# Patient Record
Sex: Female | Born: 1935 | Race: White | Hispanic: No | Smoking: Never smoker
Health system: Southern US, Community
[De-identification: ages and names within clinical notes are randomized; demographics above are authoritative.]

## PROBLEM LIST (undated history)

## (undated) DIAGNOSIS — I341 Nonrheumatic mitral (valve) prolapse: Secondary | ICD-10-CM

## (undated) DIAGNOSIS — I4891 Unspecified atrial fibrillation: Secondary | ICD-10-CM

## (undated) DIAGNOSIS — I10 Essential (primary) hypertension: Secondary | ICD-10-CM

## (undated) DIAGNOSIS — M5136 Other intervertebral disc degeneration, lumbar region: Secondary | ICD-10-CM

## (undated) DIAGNOSIS — K649 Unspecified hemorrhoids: Secondary | ICD-10-CM

## (undated) DIAGNOSIS — J449 Chronic obstructive pulmonary disease, unspecified: Secondary | ICD-10-CM

## (undated) DIAGNOSIS — I482 Chronic atrial fibrillation, unspecified: Secondary | ICD-10-CM

## (undated) DIAGNOSIS — H409 Unspecified glaucoma: Secondary | ICD-10-CM

## (undated) DIAGNOSIS — R002 Palpitations: Secondary | ICD-10-CM

## (undated) DIAGNOSIS — M810 Age-related osteoporosis without current pathological fracture: Secondary | ICD-10-CM

## (undated) DIAGNOSIS — K5790 Diverticulosis of intestine, part unspecified, without perforation or abscess without bleeding: Secondary | ICD-10-CM

## (undated) DIAGNOSIS — H25019 Cortical age-related cataract, unspecified eye: Secondary | ICD-10-CM

## (undated) DIAGNOSIS — D126 Benign neoplasm of colon, unspecified: Secondary | ICD-10-CM

## (undated) DIAGNOSIS — B019 Varicella without complication: Secondary | ICD-10-CM

## (undated) HISTORY — PX: CHOLECYSTECTOMY: SHX55

## (undated) HISTORY — DX: Palpitations: R00.2

## (undated) HISTORY — DX: Other intervertebral disc degeneration, lumbar region: M51.36

## (undated) HISTORY — PX: CATARACT EXTRACTION: SUR2

## (undated) HISTORY — DX: Varicella without complication: B01.9

## (undated) HISTORY — DX: Unspecified glaucoma: H40.9

## (undated) HISTORY — PX: CORRECTION HAMMER TOE: SUR317

## (undated) HISTORY — DX: Benign neoplasm of colon, unspecified: D12.6

## (undated) HISTORY — DX: Cortical age-related cataract, unspecified eye: H25.019

## (undated) HISTORY — DX: Chronic atrial fibrillation, unspecified: I48.20

## (undated) HISTORY — PX: BREAST LUMPECTOMY: SHX2

## (undated) HISTORY — DX: Unspecified hemorrhoids: K64.9

---

## 1998-11-07 ENCOUNTER — Ambulatory Visit (HOSPITAL_COMMUNITY): Admission: RE | Admit: 1998-11-07 | Discharge: 1998-11-07 | Payer: Self-pay | Admitting: Obstetrics & Gynecology

## 1999-11-06 ENCOUNTER — Ambulatory Visit (HOSPITAL_COMMUNITY): Admission: RE | Admit: 1999-11-06 | Discharge: 1999-11-06 | Payer: Self-pay | Admitting: Obstetrics & Gynecology

## 2000-05-11 ENCOUNTER — Ambulatory Visit (HOSPITAL_COMMUNITY): Admission: RE | Admit: 2000-05-11 | Discharge: 2000-05-11 | Payer: Self-pay | Admitting: *Deleted

## 2000-08-01 HISTORY — PX: COLONOSCOPY: SHX174

## 2000-11-18 ENCOUNTER — Ambulatory Visit (HOSPITAL_COMMUNITY): Admission: RE | Admit: 2000-11-18 | Discharge: 2000-11-18 | Payer: Self-pay | Admitting: *Deleted

## 2002-01-19 ENCOUNTER — Ambulatory Visit (HOSPITAL_COMMUNITY): Admission: RE | Admit: 2002-01-19 | Discharge: 2002-01-19 | Payer: Self-pay | Admitting: Obstetrics & Gynecology

## 2003-02-01 ENCOUNTER — Ambulatory Visit (HOSPITAL_COMMUNITY): Admission: RE | Admit: 2003-02-01 | Discharge: 2003-02-01 | Payer: Self-pay | Admitting: *Deleted

## 2005-06-24 ENCOUNTER — Ambulatory Visit: Payer: Self-pay | Admitting: Family Medicine

## 2005-08-10 ENCOUNTER — Ambulatory Visit: Payer: Self-pay | Admitting: Gastroenterology

## 2006-06-27 ENCOUNTER — Ambulatory Visit: Payer: Self-pay | Admitting: Family Medicine

## 2007-06-30 ENCOUNTER — Ambulatory Visit: Payer: Self-pay | Admitting: Family Medicine

## 2008-04-09 ENCOUNTER — Ambulatory Visit: Payer: Self-pay | Admitting: Family Medicine

## 2008-07-02 ENCOUNTER — Ambulatory Visit: Payer: Self-pay | Admitting: Family Medicine

## 2009-07-15 ENCOUNTER — Ambulatory Visit: Payer: Self-pay | Admitting: Family Medicine

## 2009-08-20 ENCOUNTER — Ambulatory Visit: Payer: Self-pay | Admitting: Ophthalmology

## 2009-09-01 ENCOUNTER — Ambulatory Visit: Payer: Self-pay | Admitting: Ophthalmology

## 2010-07-27 ENCOUNTER — Ambulatory Visit: Payer: Self-pay | Admitting: Family Medicine

## 2011-09-21 ENCOUNTER — Ambulatory Visit: Payer: Self-pay | Admitting: Family Medicine

## 2012-03-14 ENCOUNTER — Ambulatory Visit: Payer: Self-pay | Admitting: Family Medicine

## 2012-09-27 ENCOUNTER — Ambulatory Visit: Payer: Self-pay | Admitting: Family Medicine

## 2014-05-16 ENCOUNTER — Ambulatory Visit: Payer: Self-pay | Admitting: Family Medicine

## 2014-09-25 ENCOUNTER — Observation Stay: Payer: Self-pay | Admitting: Internal Medicine

## 2014-09-25 LAB — COMPREHENSIVE METABOLIC PANEL
Albumin: 3.7 g/dL (ref 3.4–5.0)
Alkaline Phosphatase: 104 U/L
Anion Gap: 6 — ABNORMAL LOW (ref 7–16)
BUN: 21 mg/dL — ABNORMAL HIGH (ref 7–18)
Bilirubin,Total: 1.1 mg/dL — ABNORMAL HIGH (ref 0.2–1.0)
Calcium, Total: 8.8 mg/dL (ref 8.5–10.1)
Chloride: 106 mmol/L (ref 98–107)
Co2: 31 mmol/L (ref 21–32)
Creatinine: 0.68 mg/dL (ref 0.60–1.30)
EGFR (African American): >60
EGFR (Non-African Amer.): >60
Glucose: 114 mg/dL — ABNORMAL HIGH (ref 65–99)
Osmolality: 289 (ref 275–301)
Potassium: 4 mmol/L (ref 3.5–5.1)
SGOT(AST): 26 U/L (ref 15–37)
SGPT (ALT): 28 U/L
Sodium: 143 mmol/L (ref 136–145)
Total Protein: 7.1 g/dL (ref 6.4–8.2)

## 2014-09-25 LAB — TROPONIN I
Troponin-I: <0.02 ng/mL
Troponin-I: <0.02 ng/mL
Troponin-I: <0.02 ng/mL

## 2014-09-25 LAB — CBC WITH DIFFERENTIAL/PLATELET
Basophil #: 0.1 10*3/uL (ref 0.0–0.1)
Basophil %: 0.7 %
Eosinophil #: 0.3 10*3/uL (ref 0.0–0.7)
Eosinophil %: 2.5 %
HCT: 45.8 % (ref 35.0–47.0)
HGB: 14.9 g/dL (ref 12.0–16.0)
Lymphocyte #: 2.6 10*3/uL (ref 1.0–3.6)
Lymphocyte %: 24.3 %
MCH: 30.7 pg (ref 26.0–34.0)
MCHC: 32.5 g/dL (ref 32.0–36.0)
MCV: 95 fL (ref 80–100)
Monocyte #: 1.8 x10 3/mm — ABNORMAL HIGH (ref 0.2–0.9)
Monocyte %: 17 %
Neutrophil #: 5.9 10*3/uL (ref 1.4–6.5)
Neutrophil %: 55.5 %
Platelet: 203 10*3/uL (ref 150–440)
RBC: 4.84 10*6/uL (ref 3.80–5.20)
RDW: 12.9 % (ref 11.5–14.5)
WBC: 10.7 10*3/uL (ref 3.6–11.0)

## 2014-09-25 LAB — URINALYSIS, COMPLETE
Bacteria: NONE SEEN
Bilirubin,UR: NEGATIVE
Blood: NEGATIVE
Glucose,UR: NEGATIVE mg/dL (ref 0–75)
Ketone: NEGATIVE
Leukocyte Esterase: NEGATIVE
Nitrite: NEGATIVE
Ph: 7 (ref 4.5–8.0)
Protein: NEGATIVE
RBC,UR: 1 /HPF (ref 0–5)
Specific Gravity: 1.008 (ref 1.003–1.030)
Squamous Epithelial: <1
WBC UR: 2 /HPF (ref 0–5)

## 2014-09-25 LAB — CK TOTAL AND CKMB (NOT AT ARMC)
CK, Total: 47 U/L (ref 26–192)
CK-MB: 1.7 ng/mL (ref 0.5–3.6)

## 2014-09-25 LAB — CK-MB
CK-MB: 1.1 ng/mL (ref 0.5–3.6)
CK-MB: 1 ng/mL (ref 0.5–3.6)

## 2014-09-25 LAB — PROTIME-INR
INR: 1
Prothrombin Time: 13 secs (ref 11.5–14.7)

## 2014-09-26 LAB — T4, FREE: Free Thyroxine: 0.87 ng/dL (ref 0.76–1.46)

## 2014-09-26 LAB — TSH: Thyroid Stimulating Horm: 4.82 u[IU]/mL — ABNORMAL HIGH

## 2014-10-01 ENCOUNTER — Ambulatory Visit: Payer: Self-pay | Admitting: Internal Medicine

## 2015-02-22 NOTE — Consult Note (Signed)
PATIENT NAME:  Gina Blankenship, Gina Blankenship MR#:  [id number] DATE OF BIRTH:  02-04-1921  DATE OF CONSULTATION:  09/25/2014  REFERRING PHYSICIAN:  Norva Riffle. Marcille Blanco, MD CONSULTING PHYSICIAN:  Analis Distler D. Clayborn Bigness, MD  PRIMARY PHYSICIAN: Irven Easterly. Kary Kos, MD   INDICATIONS: Atrial fibrillation, shortness of breath.   HISTORY OF PRESENT ILLNESS: The patient is a 81 year old white female who presented to the Emergency Room with complaints of heart racing uncontrollably. The patient states her symptoms began while she was in bed. The patient was to go to sleep, awoke with recurrent symptoms, shortness of breath, headache, and palpitations. The patient went to the next room to alert her daughter-in-law who is an LPN. Daughter-in-law gave the patient a dose of metoprolol with aspirin. Subsequently, heart rate was found to be about 150. Because of the rapid atrial fibrillation, palpitations, weakness, fatigue, shortness of breath, she was brought to the Emergency Room.   REVIEW OF SYSTEMS: No blackout spells, syncope. No nausea or vomiting. No fever. No chills. No sweats. No weight loss, no weight gain, no hemoptysis, no hematemesis. No bright red blood per rectum. She has had weakness, fatigue, palpitations, tachycardia in the past.   PAST MEDICAL HISTORY: Atrial fibrillation, mitral valve insufficiency, glaucoma, weakness, fatigue.   PAST SURGICAL HISTORY: Cataract removal, cholecystectomy.   SOCIAL HISTORY: Lives with her son. No smoking or alcohol consumption. The patient lives independently. Still drives.   FAMILY HISTORY: Pancreatic cancer.   MEDICATIONS: Metoprolol.   ALLERGIES: None.   LABORATORIES: Glucose 114, BUN of 21, creatinine 0.68, sodium 143, potassium 4, chloride 106, bicarbonate 31, calcium 8.8, albumin 2.7. LFTs are negative. Troponin is negative. White count 10.7, hemoglobin of 14.9, hematocrit 45.8, platelet count of 209,000, MCV at 95, INR of 1.   Chest x-ray negative.   PHYSICAL  EXAMINATION: VITAL SIGNS: Blood pressure 110/50, pulse of 60, respiratory rate of 14, afebrile.  HEENT: Normocephalic, atraumatic. Pupils equal and reactive to light.  NECK:  Supple.  No significant JVD, bruits, adenopathy.  LUNGS: Clear to auscultation. No significant wheeze, rhonchi, or rales.  HEART: Bradycardic. Systolic ejection murmur along the apex. PMI nondisplaced.  ABDOMEN: Benign.  EXTREMITIES: Within normal limits. NEUROLOGIC: Intact.  SKIN: Normal.   ASSESSMENT: 1.  Atrial fibrillation, paroxysmal. 2.  Glaucoma.  3.  Mitral insufficiency murmur.  5.  Shortness of breath. 6.  Weakness, fatigue.   PLAN: 1.  Agree with metoprolol therapy to help with rate control for atrial fibrillation. 2.  DVT prophylaxis. Do not recommend long-term anticoagulation.  3.  Recommend continue to follow the patient on telemetry. Rule out for myocardial infarction. 4.  Recommend GI prophylaxis with Zantac or Protonix. 5.  Mitral insufficiency appears to be reasonably stable. Consider echocardiogram. Recommend continued current therapy. Treat the patient conservatively for now. If the patient stays in sinus, we will continue low-dose beta-blockade therapy. Do not recommend permanent pacemaker placement. Do not recommend high-grade anticoagulation. Do not recommend high-grade antiarrhythmic. We will discuss the case with Dr. Ubaldo Glassing.   ____________________________ Loran Senters. Clayborn Bigness, MD ddc:LT D: 09/25/2014 16:23:38 ET T: 09/25/2014 17:24:10 ET JOB#: [id number]  cc: Taelor Moncada D. Clayborn Bigness, MD, <Dictator> Yolonda Kida MD ELECTRONICALLY SIGNED 10/04/2014 16:49

## 2015-02-22 NOTE — H&P (Signed)
PATIENT NAME:  Gina Blankenship, Gina Blankenship MR#:  [id number] DATE OF BIRTH:  Feb 18, 1921  DATE OF ADMISSION:  09/25/2014  ADMITTING DIAGNOSIS:  Atrial fibrillation with rapid ventricular rate.   REFERRING PHYSICIAN:  Valli Glance. Owens Shark, MD   PRIMARY CARE PHYSICIAN:  Irven Easterly. Kary Kos, MD  HISTORY OF PRESENT ILLNESS:  This is a 81 year old Caucasian female who presents to the Emergency Department complaining of her "heart rate beating uncontrollably." She states that her symptoms began while she was in bed. The patient never went to sleep and thus was not really awakened by the sensation. She also denies any chest pain associated with her racing heartbeat, but admits to some shortness of breath and headache that began at the time of the palpitations. The patient went in to the next room to alert her daughter-in-law, who is an LPN, to her symptoms. The daughter-in-law gave the patient a dose of metoprolol per the instructions of her cardiologist, Dr. Ubaldo Glassing. She states that her symptoms decreased in severity, at which point she decided to come to the Emergency Department. In the Emergency Department, the patient was found to have a heart rate in the range of 150 to 200 beats per minute. Again, she converted to regular atrial fibrillation and sometimes into sinus rhythm with controlled heart rate. The patient admits to weakness in her legs when she has the sensation of palpitations, and with this episode, she became diaphoretic. She states that episodes such as this happen much more frequently than she tells her physicians and that it always seems to happen in the early hours of the morning. Due to history of frequent recurrence and extreme heart rates associated with uncontrolled atrial fibrillation, the Emergency Department called for admission.   REVIEW OF SYSTEMS:  The patient denies fever, but admits to weakness in her legs when she has episodes as described above.  EYES:  Denies inflammation, but admits to some  blurred vision at times.  EARS, NOSE, AND THROAT:  The patient denies tinnitus or difficulty swallowing.  RESPIRATORY:  Denies cough, but admits to shortness of breath when she has a rapid heart rate.  CARDIOVASCULAR:  Denies chest pain, but admits to palpitations. She denies orthopnea or paroxysmal nocturnal dyspnea.  GASTROINTESTINAL:  Denies nausea or vomiting, diarrhea, or abdominal pain.  GENITOURINARY:  Denies dysuria, increased frequency, or hesitancy of urination.  ENDOCRINE:  Denies polyuria or polydipsia.  HEMATOLOGIC AND LYMPHATIC:  Denies easy bruising or bleeding.  INTEGUMENT:  Denies rashes or lesions. MUSCULOSKELETAL:  Denies myalgias or arthralgias.  NEUROLOGIC:  Denies dysarthria or numbness in her extremities.  PSYCHIATRIC:  Denies depression or suicidal ideation.   PAST MEDICAL HISTORY:  Atrial fibrillation or flutter, although this is notably not in the patient's documentation; however, she remembers discussing it with her cardiologist. Mitral valve insufficiency, cataract, and glaucoma.   PAST SURGICAL HISTORY:  Cataract removal and cholecystectomy.   SOCIAL HISTORY:  The patient lives with her son's family. She denies smoking, drinking, or doing any drugs. She is very independent and is able to take care of her own activities of daily living. The patient still drives on her own.   FAMILY HISTORY:  Significant for pancreatic cancer in her mother.   MEDICATIONS:  Metoprolol 25 mg 1 tablet p.o. daily as needed.   ALLERGIES:  No known drugs allergies.   PERTINENT LABORATORY RESULTS AND RADIOGRAPHIC FINDINGS:  Serum glucose is 114, BUN 21, creatinine 0.68, sodium 143, potassium 4, chloride 106, bicarbonate 31, calcium 8.8, serum albumin  3.7, alkaline phosphatase 104, AST 26, and ALT is 28. Troponin is negative. White blood cell count is 10.7, hemoglobin 14.9, hematocrit 45.8, platelet count 203,000, and MCV is 95. INR is 1. Chest x-ray shows no active cardiopulmonary  disease.   PHYSICAL EXAMINATION: VITAL SIGNS:  Temperature 97.4, pulse 62, respirations 16, blood pressure 107/56, pulse oximetry 98% on room air.  GENERAL: The patient is alert and oriented x 3 and in no apparent distress.  HEENT:  Normocephalic, atraumatic. Pupils are equal, round, and reactive to light and accommodation. Extraocular movements are intact. Mucous membranes are moist.  NECK:  Trachea is midline. No adenopathy.  CHEST:  Symmetric, atraumatic.  CARDIOVASCULAR:  Irregularly irregular rate with normal S1 and S2. No rubs, clicks, or murmurs appreciated.  LUNGS:  Clear to auscultation bilaterally. Normal effort and excursion.  ABDOMEN:  Positive bowel sounds. Soft, nontender, nondistended. No hepatosplenomegaly.  GENITOURINARY:  Deferred.  MUSCULOSKELETAL:  The patient moves all 4 extremities equally. There is 5/5 strength in upper and lower extremities bilaterally.  SKIN:  No rashes or lesions.  EXTREMITIES:  No clubbing, cyanosis, or edema.  NEUROLOGIC:  Cranial nerves II through XII are grossly intact.  PSYCHIATRIC:  Mood is normal. Affect is congruent.   ASSESSMENT AND PLAN:  This is a 81 year old female admitted for atrial fibrillation with rapid ventricular response.   1.  Atrial fibrillation, now rate controlled but initially with rapid ventricular response. The patient's daughter-in-law wisely gave the patient a dose of beta blocker, which converted the patient to sinus rhythm. Her run of atrial flutter in the Emergency Department broke spontaneously. However, the patient reports multiple episodes of symptoms that one can only surmise are related to a tachyarrhythmia. I have chosen to start the patient on scheduled dose of metoprolol that is half the dose that she takes when she uses it as needed. On her EKG, rhythm and rates vary greatly. With such a high rate initially seen in the Emergency Department plus the unknown length of time the patient felt these symptoms, we will  observe the patient long enough to cycle her cardiac enzymes and rule out myocardial ischemia.  2.  Glaucoma, stable at this time. We will reconcile the patient's home eyedrops with her pharmacy and restart her glaucoma treatment.  3.  Mitral valve insufficiency. There was no murmur heard on my exam. We will obtain a cardiology consult, who may be able to comment on her mitral valve and/or order an echocardiogram to evaluate if this has anything to do with her recurrent atrial fibrillation.  4.  Deep vein thrombosis prophylaxis with heparin.  5.  Gastrointestinal prophylaxis. None needed.   CODE STATUS:  The patient is a full code.   TIME SPENT ON ADMISSION ORDERS AND PATIENT CARE:  Approximately 35 minutes.    ____________________________ Norva Riffle. Marcille Blanco, MD msd:nb D: 09/25/2014 05:55:18 ET T: 09/25/2014 06:21:54 ET JOB#: [id number]  cc: Norva Riffle. Marcille Blanco, MD, <Dictator> Norva Riffle Eliazer Hemphill MD ELECTRONICALLY SIGNED 10/04/2014 1:15

## 2015-02-22 NOTE — Discharge Summary (Signed)
PATIENT NAME:  Gina Blankenship, GAUER MR#:  [id number] DATE OF BIRTH:  [birth year]  DATE OF ADMISSION:  09/25/2014 DATE OF DISCHARGE:  09/26/2014  DISCHARGE DIAGNOSIS: Atrial fibrillation with rapid ventricular rate, now converted to normal sinus rhythm with well-controlled rate.   SECONDARY DIAGNOSES: 1. Atrial fibrillation.  2. Mitral valve insufficiency.  3. Cataracts.  4. Glaucoma.   CONSULTATION: Cardiology, Dr. Clayborn Bigness.   PROCEDURES AND RADIOLOGY: Chest x-ray on the 25th of November showed no acute cardiopulmonary disease.   MAJOR LABORATORY PANEL: Urinalysis on admission was negative. Serum free T3 was within normal limits.   HISTORY AND SHORT HOSPITAL COURSE: The patient is a 81 year old female with no significant medical problems who was admitted for atrial fibrillation with rapid ventricular rate. Please see Dr. Legrand Como Diamond's dictated history and physical for further details.  She spontaneously converted to normal sinus rhythm with metoprolol and her heart rate was very well controlled, was doing much better by the 26th of November and was discharged home in stable condition.   PHYSICAL EXAMINATION: On the date of discharge. VITAL SIGNS:  Her vital signs were as follows: Temperature, heart rate 60 per minute, respirations 18 per minute, blood pressure 115/57. She was saturating 94% on room air.   CARDIOVASCULAR: S1, S2 normal. No murmurs, rubs or gallop.   LUNGS: Clear to auscultation bilaterally. No wheezing, rales, rhonchi, crepitation.   ABDOMEN: Soft, benign.   NEUROLOGIC: Nonfocal examination. All other physical examination remained at baseline.   DISCHARGE MEDICATIONS:  1. Metoprolol 12.5 mg p.o. b.i.d.  2. Aspirin 81 mg p.o. daily.   DISCHARGE DIET: Low sodium.   DISCHARGE ACTIVITY: As tolerated.   DISCHARGE INSTRUCTIONS AND FOLLOWUP: The patient was instructed to follow up with her primary care physician, Dr. Jarome Lamas, in 2 to 4 weeks. She will need  followup with Dr. Jordan Hawks from [facility] in 1 to 2 weeks.   TOTAL TIME DISCHARGING THIS PATIENT: 45 minutes.      ____________________________ [name]. [name], MD vss:TT D: 09/28/2014 19:35:30 ET T: 09/28/2014 20:01:34 ET JOB#: [id number]  cc: Toree Edling S. [name], MD, <Dictator> Irven Easterly. Kary Kos, MD Javier Docker Ubaldo Glassing, MD Remer Macho MD ELECTRONICALLY SIGNED 09/28/2014 20:45

## 2018-03-07 ENCOUNTER — Inpatient Hospital Stay: Payer: Medicare Other

## 2018-03-07 ENCOUNTER — Inpatient Hospital Stay
Admission: EM | Admit: 2018-03-07 | Discharge: 2018-03-09 | DRG: 481 | Disposition: A | Discharge status: Skilled Nursing Facility | Payer: Medicare Other | Attending: Internal Medicine | Admitting: Internal Medicine

## 2018-03-07 ENCOUNTER — Encounter
Admission: EM | Disposition: A | Discharge status: Skilled Nursing Facility | Payer: Self-pay | Source: Home / Self Care | Attending: Internal Medicine

## 2018-03-07 ENCOUNTER — Inpatient Hospital Stay: Payer: Medicare Other | Admitting: Anesthesiology

## 2018-03-07 ENCOUNTER — Other Ambulatory Visit: Payer: Self-pay

## 2018-03-07 ENCOUNTER — Encounter
Admission: RE | Admit: 2018-03-07 | Discharge: 2018-03-07 | Disposition: A | Discharge status: Home / Self Care | Payer: Medicare Other | Source: Ambulatory Visit | Attending: Internal Medicine | Admitting: Internal Medicine

## 2018-03-07 ENCOUNTER — Emergency Department: Payer: Medicare Other

## 2018-03-07 DIAGNOSIS — N179 Acute kidney failure, unspecified: Secondary | ICD-10-CM | POA: Diagnosis present

## 2018-03-07 DIAGNOSIS — Z79899 Other long term (current) drug therapy: Secondary | ICD-10-CM | POA: Diagnosis not present

## 2018-03-07 DIAGNOSIS — F0390 Unspecified dementia without behavioral disturbance: Secondary | ICD-10-CM | POA: Diagnosis present

## 2018-03-07 DIAGNOSIS — Z7982 Long term (current) use of aspirin: Secondary | ICD-10-CM

## 2018-03-07 DIAGNOSIS — S72001A Fracture of unspecified part of neck of right femur, initial encounter for closed fracture: Secondary | ICD-10-CM | POA: Diagnosis present

## 2018-03-07 DIAGNOSIS — S0181XA Laceration without foreign body of other part of head, initial encounter: Secondary | ICD-10-CM | POA: Diagnosis present

## 2018-03-07 DIAGNOSIS — Z9849 Cataract extraction status, unspecified eye: Secondary | ICD-10-CM

## 2018-03-07 DIAGNOSIS — E860 Dehydration: Secondary | ICD-10-CM | POA: Diagnosis present

## 2018-03-07 DIAGNOSIS — D62 Acute posthemorrhagic anemia: Secondary | ICD-10-CM | POA: Diagnosis not present

## 2018-03-07 DIAGNOSIS — Z7951 Long term (current) use of inhaled steroids: Secondary | ICD-10-CM

## 2018-03-07 DIAGNOSIS — J449 Chronic obstructive pulmonary disease, unspecified: Secondary | ICD-10-CM | POA: Diagnosis present

## 2018-03-07 DIAGNOSIS — S72141A Displaced intertrochanteric fracture of right femur, initial encounter for closed fracture: Secondary | ICD-10-CM | POA: Diagnosis present

## 2018-03-07 DIAGNOSIS — W19XXXA Unspecified fall, initial encounter: Secondary | ICD-10-CM | POA: Diagnosis present

## 2018-03-07 DIAGNOSIS — H9190 Unspecified hearing loss, unspecified ear: Secondary | ICD-10-CM | POA: Diagnosis present

## 2018-03-07 DIAGNOSIS — I482 Chronic atrial fibrillation: Secondary | ICD-10-CM | POA: Diagnosis present

## 2018-03-07 DIAGNOSIS — K5900 Constipation, unspecified: Secondary | ICD-10-CM | POA: Diagnosis present

## 2018-03-07 DIAGNOSIS — H409 Unspecified glaucoma: Secondary | ICD-10-CM | POA: Diagnosis present

## 2018-03-07 DIAGNOSIS — Z66 Do not resuscitate: Secondary | ICD-10-CM | POA: Diagnosis present

## 2018-03-07 DIAGNOSIS — D72829 Elevated white blood cell count, unspecified: Secondary | ICD-10-CM | POA: Diagnosis present

## 2018-03-07 DIAGNOSIS — L89892 Pressure ulcer of other site, stage 2: Secondary | ICD-10-CM | POA: Diagnosis present

## 2018-03-07 DIAGNOSIS — Z9049 Acquired absence of other specified parts of digestive tract: Secondary | ICD-10-CM | POA: Diagnosis not present

## 2018-03-07 DIAGNOSIS — L89152 Pressure ulcer of sacral region, stage 2: Secondary | ICD-10-CM | POA: Diagnosis present

## 2018-03-07 DIAGNOSIS — M810 Age-related osteoporosis without current pathological fracture: Secondary | ICD-10-CM | POA: Diagnosis present

## 2018-03-07 DIAGNOSIS — R52 Pain, unspecified: Secondary | ICD-10-CM | POA: Diagnosis present

## 2018-03-07 DIAGNOSIS — I341 Nonrheumatic mitral (valve) prolapse: Secondary | ICD-10-CM | POA: Diagnosis present

## 2018-03-07 DIAGNOSIS — Z419 Encounter for procedure for purposes other than remedying health state, unspecified: Secondary | ICD-10-CM

## 2018-03-07 DIAGNOSIS — I10 Essential (primary) hypertension: Secondary | ICD-10-CM | POA: Diagnosis present

## 2018-03-07 DIAGNOSIS — L8990 Pressure ulcer of unspecified site, unspecified stage: Secondary | ICD-10-CM

## 2018-03-07 HISTORY — PX: INTRAMEDULLARY (IM) NAIL INTERTROCHANTERIC: SHX5875

## 2018-03-07 HISTORY — DX: Essential (primary) hypertension: I10

## 2018-03-07 HISTORY — DX: Chronic obstructive pulmonary disease, unspecified: J44.9

## 2018-03-07 HISTORY — DX: Diverticulosis of intestine, part unspecified, without perforation or abscess without bleeding: K57.90

## 2018-03-07 HISTORY — DX: Unspecified atrial fibrillation: I48.91

## 2018-03-07 HISTORY — DX: Nonrheumatic mitral (valve) prolapse: I34.1

## 2018-03-07 HISTORY — DX: Age-related osteoporosis without current pathological fracture: M81.0

## 2018-03-07 LAB — CBC
HCT: 45.6 % (ref 35.0–47.0)
HEMOGLOBIN: 15.2 g/dL (ref 12.0–16.0)
MCH: 31.3 pg (ref 26.0–34.0)
MCHC: 33.4 g/dL (ref 32.0–36.0)
MCV: 93.8 fL (ref 80.0–100.0)
Platelets: 221 10*3/uL (ref 150–440)
RBC: 4.85 MIL/uL (ref 3.80–5.20)
RDW: 13.4 % (ref 11.5–14.5)
WBC: 17.1 10*3/uL — ABNORMAL HIGH (ref 3.6–11.0)

## 2018-03-07 LAB — BASIC METABOLIC PANEL
ANION GAP: 9 (ref 5–15)
BUN: 26 mg/dL — ABNORMAL HIGH (ref 6–20)
CALCIUM: 9.1 mg/dL (ref 8.9–10.3)
CO2: 27 mmol/L (ref 22–32)
Chloride: 101 mmol/L (ref 101–111)
Creatinine, Ser: 1.07 mg/dL — ABNORMAL HIGH (ref 0.44–1.00)
GFR calc non Af Amer: 43 mL/min — ABNORMAL LOW (ref >60)
GFR, EST AFRICAN AMERICAN: 50 mL/min — AB (ref >60)
Glucose, Bld: 143 mg/dL — ABNORMAL HIGH (ref 65–99)
Potassium: 4 mmol/L (ref 3.5–5.1)
Sodium: 137 mmol/L (ref 135–145)

## 2018-03-07 LAB — TYPE AND SCREEN
ABO/RH(D): O POS
Antibody Screen: NEGATIVE

## 2018-03-07 LAB — TROPONIN I

## 2018-03-07 LAB — MRSA PCR SCREENING: MRSA by PCR: NEGATIVE

## 2018-03-07 SURGERY — FIXATION, FRACTURE, INTERTROCHANTERIC, WITH INTRAMEDULLARY ROD
Anesthesia: Spinal | Site: Hip | Laterality: Right | Wound class: Clean

## 2018-03-07 MED ORDER — ONDANSETRON HCL 4 MG PO TABS: 4.0000 mg | ORAL_TABLET | Freq: Four times a day (QID) | ORAL | Status: DC | PRN

## 2018-03-07 MED ORDER — KETAMINE HCL 10 MG/ML IJ SOLN
INTRAMUSCULAR | Status: DC | PRN
  Administered 2018-03-07: 30 mg via INTRAVENOUS
  Administered 2018-03-07: 10 mg via INTRAVENOUS

## 2018-03-07 MED ORDER — BISACODYL 10 MG RE SUPP
10.0000 mg | Freq: Every day | RECTAL | Status: DC | PRN
  Administered 2018-03-08: 10 mg via RECTAL
  Filled 2018-03-07: qty 1

## 2018-03-07 MED ORDER — METOPROLOL TARTRATE 25 MG PO TABS
25.0000 mg | ORAL_TABLET | Freq: Two times a day (BID) | ORAL | Status: DC
  Administered 2018-03-07 – 2018-03-09 (×5): 25 mg via ORAL
  Filled 2018-03-07 (×6): qty 1

## 2018-03-07 MED ORDER — BUPIVACAINE HCL (PF) 0.5 % IJ SOLN
INTRAMUSCULAR | Status: DC | PRN
  Administered 2018-03-07: 3 mL

## 2018-03-07 MED ORDER — FENTANYL CITRATE (PF) 100 MCG/2ML IJ SOLN
12.5000 ug | Freq: Once | INTRAMUSCULAR | Status: AC
  Administered 2018-03-07: 12.5 ug via INTRAVENOUS
  Filled 2018-03-07: qty 2

## 2018-03-07 MED ORDER — LATANOPROST 0.005 % OP SOLN
1.0000 [drp] | Freq: Every day | OPHTHALMIC | Status: DC
  Administered 2018-03-07 – 2018-03-08 (×2): 1 [drp] via OPHTHALMIC
  Filled 2018-03-07: qty 2.5

## 2018-03-07 MED ORDER — SENNOSIDES-DOCUSATE SODIUM 8.6-50 MG PO TABS: 1.0000 | ORAL_TABLET | Freq: Every evening | ORAL | Status: DC | PRN

## 2018-03-07 MED ORDER — SODIUM CHLORIDE 0.9 % IV SOLN
INTRAVENOUS | Status: DC
  Administered 2018-03-07 – 2018-03-08 (×4): via INTRAVENOUS

## 2018-03-07 MED ORDER — HYDROCODONE-ACETAMINOPHEN 5-325 MG PO TABS
1.0000 | ORAL_TABLET | ORAL | Status: DC | PRN
  Administered 2018-03-09: 1 via ORAL
  Filled 2018-03-07: qty 1

## 2018-03-07 MED ORDER — KETOROLAC TROMETHAMINE 15 MG/ML IJ SOLN
7.5000 mg | Freq: Four times a day (QID) | INTRAMUSCULAR | Status: AC
  Administered 2018-03-07 – 2018-03-08 (×4): 7.5 mg via INTRAVENOUS
  Filled 2018-03-07 (×4): qty 1

## 2018-03-07 MED ORDER — ACETAMINOPHEN 650 MG RE SUPP: 650.0000 mg | Freq: Four times a day (QID) | RECTAL | Status: DC | PRN

## 2018-03-07 MED ORDER — KETOROLAC TROMETHAMINE 15 MG/ML IJ SOLN
15.0000 mg | Freq: Four times a day (QID) | INTRAMUSCULAR | Status: DC | PRN
  Administered 2018-03-07: 15 mg via INTRAVENOUS
  Filled 2018-03-07: qty 1

## 2018-03-07 MED ORDER — MOMETASONE FURO-FORMOTEROL FUM 100-5 MCG/ACT IN AERO
2.0000 | INHALATION_SPRAY | Freq: Two times a day (BID) | RESPIRATORY_TRACT | Status: DC
  Administered 2018-03-07 – 2018-03-09 (×5): 2 via RESPIRATORY_TRACT
  Filled 2018-03-07: qty 8.8

## 2018-03-07 MED ORDER — PROPOFOL 500 MG/50ML IV EMUL
INTRAVENOUS | Status: AC
  Filled 2018-03-07: qty 50

## 2018-03-07 MED ORDER — MIDAZOLAM HCL 5 MG/5ML IJ SOLN
INTRAMUSCULAR | Status: DC | PRN
  Administered 2018-03-07: .5 mg via INTRAVENOUS

## 2018-03-07 MED ORDER — ONDANSETRON HCL 4 MG/2ML IJ SOLN: 4.0000 mg | Freq: Four times a day (QID) | INTRAMUSCULAR | Status: DC | PRN

## 2018-03-07 MED ORDER — METOCLOPRAMIDE HCL 10 MG PO TABS: 5.0000 mg | ORAL_TABLET | Freq: Three times a day (TID) | ORAL | Status: DC | PRN

## 2018-03-07 MED ORDER — SODIUM CHLORIDE 0.9 % IV BOLUS
1000.0000 mL | Freq: Once | INTRAVENOUS | Status: AC
  Administered 2018-03-07: 1000 mL via INTRAVENOUS

## 2018-03-07 MED ORDER — BISACODYL 5 MG PO TBEC
5.0000 mg | DELAYED_RELEASE_TABLET | Freq: Every day | ORAL | Status: DC | PRN
  Administered 2018-03-08: 5 mg via ORAL
  Filled 2018-03-07: qty 1

## 2018-03-07 MED ORDER — BACITRACIN 50000 UNITS IM SOLR
INTRAMUSCULAR | Status: AC
  Filled 2018-03-07: qty 1

## 2018-03-07 MED ORDER — LIDOCAINE HCL (PF) 2 % IJ SOLN
INTRAMUSCULAR | Status: AC
  Filled 2018-03-07: qty 10

## 2018-03-07 MED ORDER — OXYCODONE HCL 5 MG PO TABS: 5.0000 mg | ORAL_TABLET | Freq: Once | ORAL | Status: DC | PRN

## 2018-03-07 MED ORDER — PHENYLEPHRINE HCL 10 MG/ML IJ SOLN
INTRAMUSCULAR | Status: AC
  Filled 2018-03-07: qty 1

## 2018-03-07 MED ORDER — METOCLOPRAMIDE HCL 5 MG/ML IJ SOLN: 5.0000 mg | Freq: Three times a day (TID) | INTRAMUSCULAR | Status: DC | PRN

## 2018-03-07 MED ORDER — MIDAZOLAM HCL 2 MG/2ML IJ SOLN
INTRAMUSCULAR | Status: AC
  Filled 2018-03-07: qty 2

## 2018-03-07 MED ORDER — KETAMINE HCL 50 MG/ML IJ SOLN
INTRAMUSCULAR | Status: AC
  Filled 2018-03-07: qty 10

## 2018-03-07 MED ORDER — PROPOFOL 500 MG/50ML IV EMUL
INTRAVENOUS | Status: DC | PRN
  Administered 2018-03-07: 25 ug/kg/min via INTRAVENOUS

## 2018-03-07 MED ORDER — FENTANYL CITRATE (PF) 100 MCG/2ML IJ SOLN: INTRAMUSCULAR | Status: DC | PRN

## 2018-03-07 MED ORDER — SODIUM CHLORIDE 0.9 % IR SOLN
Status: DC | PRN
  Administered 2018-03-07: 200 mL

## 2018-03-07 MED ORDER — MAGNESIUM CITRATE PO SOLN
1.0000 | Freq: Once | ORAL | Status: DC | PRN
  Filled 2018-03-07: qty 296

## 2018-03-07 MED ORDER — PHENYLEPHRINE HCL 10 MG/ML IJ SOLN
INTRAMUSCULAR | Status: DC | PRN
  Administered 2018-03-07 (×3): 100 ug via INTRAVENOUS

## 2018-03-07 MED ORDER — MELATONIN 5 MG PO TABS
5.0000 mg | ORAL_TABLET | Freq: Every evening | ORAL | Status: DC | PRN
  Filled 2018-03-07: qty 1

## 2018-03-07 MED ORDER — ACETAMINOPHEN 325 MG PO TABS: 650.0000 mg | ORAL_TABLET | Freq: Four times a day (QID) | ORAL | Status: DC | PRN

## 2018-03-07 MED ORDER — SODIUM CHLORIDE FLUSH 0.9 % IV SOLN
INTRAVENOUS | Status: AC
  Filled 2018-03-07: qty 10

## 2018-03-07 MED ORDER — DOCUSATE SODIUM 100 MG PO CAPS: 100.0000 mg | ORAL_CAPSULE | Freq: Two times a day (BID) | ORAL | Status: DC

## 2018-03-07 MED ORDER — DOCUSATE SODIUM 100 MG PO CAPS
100.0000 mg | ORAL_CAPSULE | Freq: Two times a day (BID) | ORAL | Status: DC
  Administered 2018-03-07 – 2018-03-08 (×2): 100 mg via ORAL
  Filled 2018-03-07 (×2): qty 1

## 2018-03-07 MED ORDER — OXYCODONE HCL 5 MG/5ML PO SOLN: 5.0000 mg | Freq: Once | ORAL | Status: DC | PRN

## 2018-03-07 MED ORDER — FLUTICASONE PROPIONATE 50 MCG/ACT NA SUSP
2.0000 | Freq: Every day | NASAL | Status: DC
  Administered 2018-03-08 – 2018-03-09 (×2): 2 via NASAL
  Filled 2018-03-07: qty 16

## 2018-03-07 MED ORDER — FENTANYL CITRATE (PF) 100 MCG/2ML IJ SOLN: 25.0000 ug | INTRAMUSCULAR | Status: DC | PRN

## 2018-03-07 MED ORDER — CEFAZOLIN SODIUM-DEXTROSE 2-4 GM/100ML-% IV SOLN
2.0000 g | Freq: Once | INTRAVENOUS | Status: AC
  Administered 2018-03-07: 2 g via INTRAVENOUS
  Filled 2018-03-07: qty 100

## 2018-03-07 MED ORDER — ALBUTEROL SULFATE (2.5 MG/3ML) 0.083% IN NEBU
2.5000 mg | INHALATION_SOLUTION | RESPIRATORY_TRACT | Status: DC | PRN
  Filled 2018-03-07: qty 3

## 2018-03-07 MED ORDER — ONDANSETRON HCL 4 MG/2ML IJ SOLN
4.0000 mg | Freq: Once | INTRAMUSCULAR | Status: AC
  Administered 2018-03-07: 4 mg via INTRAVENOUS
  Filled 2018-03-07: qty 2

## 2018-03-07 MED ORDER — MORPHINE SULFATE (PF) 2 MG/ML IV SOLN: 0.5000 mg | INTRAVENOUS | Status: DC | PRN

## 2018-03-07 MED ORDER — ACETAMINOPHEN 500 MG PO TABS
500.0000 mg | ORAL_TABLET | Freq: Four times a day (QID) | ORAL | Status: AC
  Administered 2018-03-07 – 2018-03-08 (×4): 500 mg via ORAL
  Filled 2018-03-07 (×4): qty 1

## 2018-03-07 SURGICAL SUPPLY — 30 items
BLADE HELICAL TFNA 95 STRL (Anchor) ×2 IMPLANT
BLADE HELICAL TFNA 95MM STRL (Anchor) ×1 IMPLANT
BNDG COHESIVE 4X5 TAN STRL (GAUZE/BANDAGES/DRESSINGS) IMPLANT
BRUSH SCRUB EZ  4% CHG (MISCELLANEOUS) ×4
BRUSH SCRUB EZ 4% CHG (MISCELLANEOUS) ×2 IMPLANT
CANISTER SUCT 1200ML W/VALVE (MISCELLANEOUS) ×3 IMPLANT
CHLORAPREP W/TINT 26ML (MISCELLANEOUS) ×3 IMPLANT
DRAPE SHEET LG 3/4 BI-LAMINATE (DRAPES) ×3 IMPLANT
DRAPE U-SHAPE 47X51 STRL (DRAPES) ×3 IMPLANT
DRSG OPSITE POSTOP 3X4 (GAUZE/BANDAGES/DRESSINGS) ×3 IMPLANT
DRSG OPSITE POSTOP 4X6 (GAUZE/BANDAGES/DRESSINGS) ×3 IMPLANT
ELECT REM PT RETURN 9FT ADLT (ELECTROSURGICAL) ×3
ELECTRODE REM PT RTRN 9FT ADLT (ELECTROSURGICAL) ×1 IMPLANT
GLOVE INDICATOR 8.0 STRL GRN (GLOVE) ×3 IMPLANT
GLOVE SURG ORTHO 8.0 STRL STRW (GLOVE) ×3 IMPLANT
GOWN STRL REUS W/ TWL LRG LVL3 (GOWN DISPOSABLE) ×1 IMPLANT
GOWN STRL REUS W/ TWL XL LVL3 (GOWN DISPOSABLE) ×1 IMPLANT
GOWN STRL REUS W/TWL LRG LVL3 (GOWN DISPOSABLE) ×2
GOWN STRL REUS W/TWL XL LVL3 (GOWN DISPOSABLE) ×2
GUIDEWIRE 3.2X400 (WIRE) ×9 IMPLANT
KIT PATIENT CARE HANA TABLE (KITS) ×3 IMPLANT
KIT TURNOVER CYSTO (KITS) ×3 IMPLANT
NAIL CANN TFNA 9 130D 360 (Nail) ×3 IMPLANT
NS IRRIG 1000ML POUR BTL (IV SOLUTION) ×3 IMPLANT
PACK HIP COMPR (MISCELLANEOUS) ×3 IMPLANT
STAPLER SKIN PROX 35W (STAPLE) ×3 IMPLANT
SUT VIC AB 0 CT1 36 (SUTURE) ×3 IMPLANT
SUT VIC AB 2-0 CT1 27 (SUTURE) ×2
SUT VIC AB 2-0 CT1 TAPERPNT 27 (SUTURE) ×1 IMPLANT
TOWEL OR 17X26 4PK STRL BLUE (TOWEL DISPOSABLE) ×3 IMPLANT

## 2018-03-07 NOTE — NC FL2 (Signed)
[place] LEVEL OF CARE SCREENING TOOL     IDENTIFICATION  Patient Name: Gina Blankenship Birthdate: 1921/02/23 Sex: female Admission Date (Current Location): 03/07/2018  [place] and [place] Number:  Engineering geologist and Address:  [facility], [address], [place], [place] [address]      Provider Number: [id number]  Attending Physician Name and Address:  Demetrios Loll, MD  Relative Name and Phone Number:       Current Level of Care: Hospital Recommended Level of Care: [place] Prior Approval Number:    Date Approved/Denied:   PASRR Number: [id number])  Discharge Plan: SNF    Current Diagnoses: Patient Active Problem List   Diagnosis Date Noted  . Closed right hip fracture ([place]) 03/07/2018    Orientation RESPIRATION BLADDER Height & Weight     Self, Place  Normal Continent Weight: 117 lb (53.1 kg) Height:  5\' 6"  (167.6 cm)  BEHAVIORAL SYMPTOMS/MOOD NEUROLOGICAL BOWEL NUTRITION STATUS      Continent Diet(Diet: NPO for surgery to be advanced. )  AMBULATORY STATUS COMMUNICATION OF NEEDS Skin   Extensive Assist Verbally Surgical wounds, PU Stage and Appropriate Care(pressure ulcer stage 2 on left foot and sacrum. )                       Personal Care Assistance Level of Assistance  Bathing, Feeding, Dressing Bathing Assistance: Limited assistance Feeding assistance: Independent Dressing Assistance: Limited assistance     Functional Limitations Info  Sight, Hearing, Speech Sight Info: Adequate Hearing Info: Impaired Speech Info: Adequate    SPECIAL CARE FACTORS FREQUENCY  PT (By licensed PT), OT (By licensed OT)     PT Frequency: (5) OT Frequency: (5)            Contractures      Additional Factors Info  Code Status, Allergies Code Status Info: (DNR ) Allergies Info: (No Known Allergies. )           Current Medications (03/07/2018):  This is the current hospital active medication  list Current Facility-Administered Medications  Medication Dose Route Frequency Provider Last Rate Last Dose  . 0.9 %  sodium chloride infusion   Intravenous Continuous Demetrios Loll, MD 50 mL/hr at 03/07/18 1143    . acetaminophen (TYLENOL) tablet 650 mg  650 mg Oral Q6H PRN Demetrios Loll, MD       Or  . acetaminophen (TYLENOL) suppository 650 mg  650 mg Rectal Q6H PRN Demetrios Loll, MD      . albuterol (PROVENTIL) (2.5 MG/3ML) 0.083% nebulizer solution 2.5 mg  2.5 mg Nebulization Q2H PRN Demetrios Loll, MD      . bisacodyl (DULCOLAX) EC tablet 5 mg  5 mg Oral Daily PRN Demetrios Loll, MD      . docusate sodium (COLACE) capsule 100 mg  100 mg Oral BID Demetrios Loll, MD      . fluticasone [facility]) 50 MCG/ACT nasal spray 2 spray  2 spray Each Nare Daily Demetrios Loll, MD      . HYDROcodone-acetaminophen (NORCO/VICODIN) 5-325 MG per tablet 1-2 tablet  1-2 tablet Oral Q4H PRN Demetrios Loll, MD      . ketorolac (TORADOL) 15 MG/ML injection 15 mg  15 mg Intravenous Q6H PRN Demetrios Loll, MD   15 mg at 03/07/18 1137  . latanoprost (XALATAN) 0.005 % ophthalmic solution 1 drop  1 drop Both Eyes QHS Demetrios Loll, MD      . Melatonin TABS  5 mg  5 mg Oral QHS PRN Demetrios Loll, MD      . metoprolol tartrate (LOPRESSOR) tablet 25 mg  25 mg Oral BID Demetrios Loll, MD   25 mg at 03/07/18 1137  . mometasone-formoterol (DULERA) 100-5 MCG/ACT inhaler 2 puff  2 puff Inhalation BID Demetrios Loll, MD   2 puff at 03/07/18 1142  . ondansetron (ZOFRAN) tablet 4 mg  4 mg Oral Q6H PRN Demetrios Loll, MD       Or  . ondansetron [facility]) injection 4 mg  4 mg Intravenous Q6H PRN Demetrios Loll, MD      . senna-docusate (Senokot-S) tablet 1 tablet  1 tablet Oral QHS PRN Demetrios Loll, MD         Discharge Medications: Please see discharge summary for a list of discharge medications.  Relevant Imaging Results:  Relevant Lab Results:   Additional Information (SSN: [id number])  Calil Amor, Veronia Beets, LCSW

## 2018-03-07 NOTE — Transfer of Care (Signed)
Immediate Anesthesia Transfer of Care Note  Patient: Gina Blankenship  Procedure(s) Performed: INTRAMEDULLARY (IM) NAIL INTERTROCHANTRIC (Right Hip)  Patient Location: PACU  Anesthesia Type:Spinal  Level of Consciousness: awake and responds to stimulation  Airway & Oxygen Therapy: Patient Spontanous Breathing and Patient connected to nasal cannula oxygen  Post-op Assessment: Report given to RN and Post -op Vital signs reviewed and stable  Post vital signs: Reviewed and stable  Last Vitals:  Vitals Value Taken Time  BP 140/101 03/07/2018  5:20 PM  Temp    Pulse 75 03/07/2018  5:20 PM  Resp 27 03/07/2018  5:20 PM  SpO2 100 % 03/07/2018  5:20 PM  Vitals shown include unvalidated device data.  Last Pain:  Vitals:   03/07/18 1516  TempSrc: Temporal  PainSc: 0-No pain         Complications: No apparent anesthesia complications

## 2018-03-07 NOTE — H&P (Signed)
[facility] NAME: Gina Blankenship    MR#:  [id number]  DATE OF BIRTH:  Jul 02, 1921  DATE OF ADMISSION:  03/07/2018  PRIMARY CARE PHYSICIAN: Maryland Pink, MD   REQUESTING/REFERRING PHYSICIAN: Dr. Beather Arbour  CHIEF COMPLAINT:   Chief Complaint  Patient presents with  . Fall  . Head Injury  . Hip Injury   Fall and right hip pain today. HISTORY OF PRESENT ILLNESS:  Gina Blankenship  is a 84 y.o. female with a known history of hypertension, A. fib, COPD, diverticulosis, osteoporosis and mitral valve prolapse.  The patient fell at about 3 AM today.  She had head injury and complains of right hip pain. She has dementia and is poor historian.  History is obtained from her daughter-in-law.  She does not know whether she passed out or not.  She was found right hip fracture. PAST MEDICAL HISTORY:   Past Medical History:  Diagnosis Date  . A-fib ([place])   . COPD (chronic obstructive pulmonary disease) ([place])   . COPD (chronic obstructive pulmonary disease) ([place])   . Diverticulosis   . Hypertension   . Mitral valve prolapse   . Osteoporosis     PAST SURGICAL HISTORY:   Past Surgical History:  Procedure Laterality Date  . CATARACT EXTRACTION      SOCIAL HISTORY:   Social History   Tobacco Use  . Smoking status: Never Smoker  . Smokeless tobacco: Never Used  Substance Use Topics  . Alcohol use: Not on file    FAMILY HISTORY:   Family History  Problem Relation Age of Onset  . Pancreatic cancer Mother   . Emphysema Sister     DRUG ALLERGIES:  No Known Allergies  REVIEW OF SYSTEMS:   Review of Systems  Constitutional: Negative for chills, fever and malaise/fatigue.  HENT: Negative for sore throat.   Eyes: Negative for blurred vision and double vision.  Respiratory: Negative for cough, hemoptysis, shortness of breath, wheezing and stridor.   Cardiovascular: Negative for chest pain, palpitations, orthopnea and leg  swelling.  Gastrointestinal: Negative for abdominal pain, blood in stool, diarrhea, melena, nausea and vomiting.  Genitourinary: Negative for dysuria, flank pain and hematuria.  Musculoskeletal: Positive for falls and joint pain. Negative for back pain.  Skin: Negative for rash.  Neurological: Negative for dizziness, sensory change, focal weakness, seizures, weakness and headaches.  Endo/Heme/Allergies: Negative for polydipsia.    MEDICATIONS AT HOME:   Prior to Admission medications   Medication Sig Start Date End Date Taking? Authorizing Provider  acetaminophen (TYLENOL) 325 MG tablet Take 650 mg by mouth every 4 (four) hours as needed for pain.   Yes [provider]  aspirin EC 81 MG tablet Take 81 mg by mouth at bedtime.    Yes [provider]  Calcium Carbonate-Vitamin D (OYSTER SHELL CALCIUM 500 + D) 500-125 MG-UNIT TABS Take 1 tablet by mouth 2 (two) times daily.   Yes [provider]  fluticasone (FLONASE) 50 MCG/ACT nasal spray Place 2 sprays into the nose daily.   Yes [provider]  Melatonin 5 MG TABS Take 5 mg by mouth at bedtime as needed for sleep.   Yes [provider]  metoprolol tartrate (LOPRESSOR) 25 MG tablet Take 25 mg by mouth 2 (two) times daily. 02/06/18  Yes [provider]  Multiple Vitamin (MULTI-VITAMINS) TABS Take 1 tablet by mouth daily.   Yes [provider]  Travoprost, BAK Free, (TRAVATAN Z) 0.004 %  SOLN ophthalmic solution Apply 1 drop to eye at bedtime. 08/25/15  Yes [provider]  Grant Ruts INHUB 100-50 MCG/DOSE AEPB Inhale 1 puff into the lungs 2 (two) times daily. 02/06/18  Yes [provider]      VITAL SIGNS:  Blood pressure (!) 174/79, pulse 84, temperature (!) 97.4 F (36.3 C), temperature source Axillary, resp. rate 18, height 5\' 6"  (1.676 m), weight 117 lb (53.1 kg), SpO2 99 %.  PHYSICAL EXAMINATION:  Physical Exam  GENERAL:  84 y.o.-year-old patient lying in the  bed with no acute distress.  EYES: Pupils equal, round, reactive to light and accommodation. No scleral icterus. Extraocular muscles intact.  HEENT: Head atraumatic, normocephalic. Oropharynx and nasopharynx clear.  NECK:  Supple, no jugular venous distention. No thyroid enlargement, no tenderness.  LUNGS: Normal breath sounds bilaterally, no wheezing, rales,rhonchi or crepitation. No use of accessory muscles of respiration.  CARDIOVASCULAR: S1, S2 irregular rhythm. No murmurs, rubs, or gallops.  ABDOMEN: Soft, nontender, nondistended. Bowel sounds present. No organomegaly or mass.  EXTREMITIES: No pedal edema, cyanosis, or clubbing.  NEUROLOGIC: Cranial nerves II through XII are intact. Muscle strength 4/5 in all extremities except right leg. Sensation intact. Gait not checked.  PSYCHIATRIC: The patient is alert and oriented x 3.  SKIN: No obvious rash, lesion, or ulcer.   LABORATORY PANEL:   CBC Recent Labs  Lab 03/07/18 0432  WBC 17.1*  HGB 15.2  HCT 45.6  PLT 221   ------------------------------------------------------------------------------------------------------------------  Chemistries  Recent Labs  Lab 03/07/18 0432  NA 137  K 4.0  CL 101  CO2 27  GLUCOSE 143*  BUN 26*  CREATININE 1.07*  CALCIUM 9.1   ------------------------------------------------------------------------------------------------------------------  Cardiac Enzymes Recent Labs  Lab 03/07/18 0432  TROPONINI <0.03   ------------------------------------------------------------------------------------------------------------------  RADIOLOGY:  Ct Head Wo Contrast  Result Date: 03/07/2018 CLINICAL DATA:  84 y/o  F; fall with head injury. EXAM: CT HEAD WITHOUT CONTRAST TECHNIQUE: Contiguous axial images were obtained from the base of the skull through the vertex without intravenous contrast. COMPARISON:  None. FINDINGS: Brain: No evidence of acute infarction, hemorrhage, hydrocephalus, extra-axial  collection or mass lesion/mass effect. Nonspecific foci of hypoattenuation in subcortical and periventricular white matter compatible with moderate chronic microvascular ischemic changes. Moderate diffuse brain parenchymal volume loss. Vascular: Calcific atherosclerosis of vertebral arteries and carotid siphons. Skull: Normal. Negative for fracture or focal lesion. Sinuses/Orbits: No acute finding. Other: None. IMPRESSION: 1. No acute intracranial abnormality or calvarial fracture identified. 2. Moderate chronic microvascular ischemic changes and parenchymal volume loss of the brain. Electronically Signed   By: Kristine Garbe M.D.   On: 03/07/2018 06:44   Dg Chest Port 1 View  Result Date: 03/07/2018 CLINICAL DATA:  84 year old female with right hip fracture. Preop radiograph. EXAM: PORTABLE CHEST 1 VIEW COMPARISON:  Chest radiograph dated 09/25/2014 FINDINGS: There is cardiomegaly with increase in the size of the cardiopericardial silhouette since the prior radiograph. No vascular congestion or edema. There is no focal consolidation, pleural effusion, or pneumothorax. No acute osseous pathology. Osteopenia. Atherosclerotic calcification of the aorta. IMPRESSION: 1. No acute cardiopulmonary process. 2. Cardiomegaly. Electronically Signed   By: Anner Crete M.D.   On: 03/07/2018 06:23   Dg Hip Unilat  With Pelvis 2-3 Views Right  Result Date: 03/07/2018 CLINICAL DATA:  84 y/o  F; fall with possible right hip fracture. EXAM: DG HIP (WITH OR WITHOUT PELVIS) 2-3V RIGHT COMPARISON:  None. FINDINGS: Acute right intertrochanteric hip fracture with coxa vara deformity. No hip  joint dislocation. No pelvic fracture or diastasis identified. No proximal left hip fracture or dislocation. Vascular calcifications noted. IMPRESSION: Acute right intertrochanteric hip fracture with coxa vara deformity. Electronically Signed   By: Kristine Garbe M.D.   On: 03/07/2018 06:20      IMPRESSION AND PLAN:     Right hip fracture The patient will be admitted to medical floor.   N.p.o. except medication and sips of water, IV fluid support and pain control. Orthopedic surgery consult.  Hold aspirin and DVT prophylaxis for possible surgery.  Dehydration.  Start normal saline IV and follow-up BMP.  Leukocytosis.  Possible due to reaction, follow-up urinalysis and CBC.  History of chronic A. Fib. Hold aspirin and continue Lopressor.  COPD.  Stable, nebulizer as needed.  Hypertension.  Continue Lopressor.  Dementia.  Aspiration fall precaution.  All the records are reviewed and case discussed with ED provider. Management plans discussed with the patient, her son and daughter-in-law, and they are in agreement.  CODE STATUS: DNR  TOTAL TIME TAKING CARE OF THIS PATIENT: 46 minutes.    Demetrios Loll M.D on 03/07/2018 at 7:54 AM  Between 7am to 6pm - Pager - [phone]  After 6pm go to [email or web address] - Proofreader  [facility]  Office  [phone]  CC: Primary care physician; Maryland Pink, MD   Note: This dictation was prepared with Dragon dictation along with smaller phrase technology. Any transcriptional errors that result from this process are unin

## 2018-03-07 NOTE — Anesthesia Post-op Follow-up Note (Deleted)
Anesthesia QCDR form completed.        

## 2018-03-07 NOTE — Progress Notes (Signed)
Advanced Care Plan.  Purpose of Encounter: CODE STATUS. Parties in Attendance: The patient, his son, daughter-in-law and me. Patient's Decisional Capacity: No. Medical Story: Gina Blankenship  is a 84 y.o. female with a known history of hypertension, A. fib, COPD, diverticulosis, osteoporosis and mitral valve prolapse.  She is being admitted for right hip fracture.  I discussed about the patient's condition, prognosis and CODE STATUS.  Per her son and daughter-in-law, the patient code status is DNR.  Plan:  Code Status: DNR. Time spent discussing advance care planning: 17 minutes.

## 2018-03-07 NOTE — Clinical Social Work Placement (Signed)
   CLINICAL SOCIAL WORK PLACEMENT  NOTE  Date:  03/07/2018  Patient Details  Name: Gina Blankenship MRN: [id number] Date of Birth: 1921/10/20  Clinical Social Work is seeking post-discharge placement for this patient at the [place] level of care (*CSW will initial, date and re-position this form in  chart as items are completed):  Yes   Patient/family provided with [facility] Work Department's list of facilities offering this level of care within the geographic area requested by the patient (or if unable, by the patient's family).  Yes   Patient/family informed of their freedom to choose among providers that offer the needed level of care, that participate in Medicare, Medicaid or managed care program needed by the patient, have an available bed and are willing to accept the patient.  Yes   Patient/family informed of [facility]'s ownership interest in [facility] and [facility], as well as of the fact that they are under no obligation to receive care at these facilities.  PASRR submitted to EDS on 03/07/18     PASRR number received on 03/07/18     Existing PASRR number confirmed on       FL2 transmitted to all facilities in geographic area requested by pt/family on 03/07/18     FL2 transmitted to all facilities within larger geographic area on       Patient informed that his/her managed care company has contracts with or will negotiate with certain facilities, including the following:            Patient/family informed of bed offers received.  Patient chooses bed at       Physician recommends and patient chooses bed at      Patient to be transferred to   on  .  Patient to be transferred to facility by       Patient family notified on   of transfer.  Name of family member notified:        PHYSICIAN       Additional Comment:    _______________________________________________ Marnisha Stampley, Veronia Beets, LCSW 03/07/2018, 12:28 PM

## 2018-03-07 NOTE — Clinical Social Work Note (Signed)
Clinical Social Work Assessment  Patient Details  Name: Gina Blankenship MRN: [id number] Date of Birth: 09/08/1921  Date of referral:  03/07/18               Reason for consult:  Facility Placement                Permission sought to share information with:  Chartered certified accountant granted to share information::  Yes, Verbal Permission Granted  Name::      [place]::   [place]   Relationship::     Contact Information:     Housing/Transportation Living arrangements for the past 2 months:  [place] of Information:  Adult Children Patient Interpreter Needed:  None Criminal Activity/Legal Involvement Pertinent to Current Situation/Hospitalization:  No - Comment as needed Significant Relationships:  Adult Children Lives with:  Adult Children Do you feel safe going back to the place where you live?  Yes Need for family participation in patient care:  Yes (Comment)  Care giving concerns:  Patient lives in [place] with her son Minna Merritts and daughter in Psychologist, prison and probation services.    Social Worker assessment / plan:  Holiday representative (CSW) reviewed chart and noted that patient will have surgery for a hip fracture today. CSW met with patient and her son Minna Merritts and daughter in law Delores were at bedside. Patient was alert and oriented X3 however she was hard of hearing. CSW introduced self and explained role of CSW department. Per son patient lives with him and his wife in [place]. Patient's daughter in law is a retired Marine scientist from [employer]. CSW explained that PT will evaluate patient after surgery and make a recommendation of home health or SNF. Family prefers SNF. CSW explained that [facility] will have to approve SNF. Patient's son and daughter in law verbalized their understanding and are agreeable to SNF search in [place]. FL2 complete and faxed out.   CSW presented bed offers to patient's son and he chose [facility]. Son  stated that patient is okay with a semi-private room. Per [facility] admissions coordinator at [facility] she will start [facility] SN[facility] authorization. CSW will continue to follow and assist as needed.   Employment status:  Disabled (Comment on whether or not currently receiving Disability), Retired Nurse, adult PT Recommendations:  Not assessed at this time Information / Referral to community resources:  [place]  Patient/Family's Response to care:  Patient's family chose [facility]   Patient/Family's Understanding of and Emotional Response to Diagnosis, Current Treatment, and Prognosis:  Patient's family was very pleasant and thanked CSW for assistance.   Emotional Assessment Appearance:  Appears stated age Attitude/Demeanor/Rapport:    Affect (typically observed):  Accepting, Adaptable, Pleasant Orientation:  Oriented to Self, Oriented to Place, Oriented to  Time, Oriented to Situation Alcohol / Substance use:  Not Applicable Psych involvement (Current and /or in the community):  No (Comment)  Discharge Needs  Concerns to be addressed:  Discharge Planning Concerns Readmission within the last 30 days:  No Current discharge risk:  Dependent with Mobility Barriers to Discharge:  Continued Medical Work up   [employer], Veronia Beets, LCSW 03/07/2018, 1:47 PM

## 2018-03-07 NOTE — Op Note (Signed)
DATE OF SURGERY:  03/07/2018  TIME: 5:25 PM  PATIENT NAME:  Gina Blankenship  AGE: 84 y.o.  PRE-OPERATIVE DIAGNOSIS:  fractured right hip  POST-OPERATIVE DIAGNOSIS:  SAME  PROCEDURE:  INTRAMEDULLARY (IM) NAIL INTERTROCHANTRIC  SURGEON:  Lovell Sheehan  EBL:  387 cc  COMPLICATIONS:  None apparent  OPERATIVE IMPLANTS: Synthes trochanteric femoral nail  9 mm x 360 mm  with interlocking helical blade  95 mm  PREOPERATIVE INDICATIONS:  Gina Blankenship is a 84 y.o. year old who fell and suffered a hip fracture. She was brought into the ER and then admitted and optimized and then elected for surgical intervention.    The risks benefits and alternatives were discussed with the patient including but not limited to the risks of nonoperative treatment, versus surgical intervention including infection, bleeding, nerve injury, malunion, nonunion, hardware prominence, hardware failure, need for hardware removal, blood clots, cardiopulmonary complications, morbidity, mortality, among others, and they were willing to proceed.    OPERATIVE PROCEDURE:  The patient was brought to the operating room and placed in the supine position.  Spinal anesthesia was administered, with a foley. She was placed on the fracture table.  Closed reduction was performed under C-arm guidance. The length of the femur was also measured using fluoroscopy. Time out was then performed after sterile prep and drape. She received preoperative antibiotics.  Incision was made proximal to the greater trochanter. A guidewire was placed in the appropriate position. Confirmation was made on AP and lateral views. The above-named nail was opened. I opened the proximal femur with a reamer. I then placed the nail by hand easily down. I did not need to ream the femur.  Once the nail was completely seated, I placed a guidepin into the femoral head into the center center position through a second incision.  I measured the length, and then  reamed the lateral cortex and up into the head. I then placed the helical blade. Slight compression was applied. Anatomic fixation achieved. Bone quality was average.  I then secured the proximal interlock.  I then removed the instruments, and took final C-arm pictures AP and lateral the entire length of the leg. Anatomic reconstruction was achieved, and the wounds were irrigated copiously and closed with Vicryl  followed by staples and dry sterile dressing. Sponge and needle count were correct.   The patient was awakened and returned to PACU in stable and satisfactory condition. There no complications and the patient tolerated the procedure well.  She will be weightbearing as tolerated.    Lovell Sheehan

## 2018-03-07 NOTE — Anesthesia Preprocedure Evaluation (Deleted)
Anesthesia Evaluation  Patient identified by MRN, date of birth, ID band Patient awake    Reviewed: Allergy & Precautions, NPO status , Patient's Chart, lab work & pertinent test results, reviewed documented beta blocker date and time   Airway        Dental   Pulmonary COPD,           Cardiovascular hypertension, Pt. on home beta blockers and Pt. on medications + dysrhythmias Atrial Fibrillation + Valvular Problems/Murmurs MVP      Neuro/Psych    GI/Hepatic Neg liver ROS,   Endo/Other  negative endocrine ROS  Renal/GU negative Renal ROS     Musculoskeletal negative musculoskeletal ROS (+)   Abdominal   Peds  Hematology negative hematology ROS (+)   Anesthesia Other Findings   Reproductive/Obstetrics                             Anesthesia Physical Anesthesia Plan  ASA: III  Anesthesia Plan:    Post-op Pain Management:    Induction:   PONV Risk Score and Plan:   Airway Management Planned:   Additional Equipment:   Intra-op Plan:   Post-operative Plan:   Informed Consent:   Plan Discussed with:   Anesthesia Plan Comments:         Anesthesia Quick Evaluation

## 2018-03-07 NOTE — ED Provider Notes (Signed)
[facility] Emergency Department Provider Note   ____________________________________________   First MD Initiated Contact with Patient 03/07/18 [phone]     (approximate)  I have reviewed the triage vital signs and the nursing notes.   HISTORY  Chief Complaint Fall; Head Injury; and Hip Injury  History obtained via family members  HPI Gina Blankenship is a 84 y.o. female brought to the ED from home via EMS status post fall with head injury and possible right hip fracture.  Patient unsure why she fell.  She was found on the floor when EMS arrived with facial laceration and right hip shortening and rotation.  Patient lives with her son and daughter-in-law.  Daughter-in-law heard something approximately 3 AM and found the patient on the floor.  Patient is usually ambulatory with walker.  Denies use of anticoagulants.  Denies chest pain, shortness of breath, abdominal pain, nausea or vomiting.   Past Medical History:  Diagnosis Date  . COPD (chronic obstructive pulmonary disease) ([place])   . Hypertension   . Atrial fibrillation , unspecified (CMS-HCC)  . Cataract cortical, senile  . Chickenpox  . Colon adenoma, unspecified  . Diverticulosis  . Glaucoma  . Hemorrhoids  . Lumbar degenerative disc disease  . Mitral valve prolapse  . Osteoporosis  . Palpitations    There are no active problems to display for this patient.   Past Surgical History:  Procedure Laterality Date  . CATARACT EXTRACTION      Prior to Admission medications   Medication Sig Start Date End Date Taking? Authorizing Provider  acetaminophen (TYLENOL) 325 MG tablet Take 650 mg by mouth every 4 (four) hours as needed for pain.   Yes [provider]  aspirin EC 81 MG tablet Take 81 mg by mouth at bedtime.    Yes [provider]  Calcium Carbonate-Vitamin D (OYSTER SHELL CALCIUM 500 + D) 500-125 MG-UNIT TABS Take 1 tablet by mouth 2 (two) times daily.   Yes [provider]  fluticasone (FLONASE) 50 MCG/ACT nasal spray Place 2 sprays into the nose daily.   Yes [provider]  Melatonin 5 MG TABS Take 5 mg by mouth at bedtime as needed for sleep.   Yes [provider]  metoprolol tartrate (LOPRESSOR) 25 MG tablet Take 25 mg by mouth 2 (two) times daily. 02/06/18  Yes [provider]  Multiple Vitamin (MULTI-VITAMINS) TABS Take 1 tablet by mouth daily.   Yes [provider]  Travoprost, BAK Free, (TRAVATAN Z) 0.004 % SOLN ophthalmic solution Apply 1 drop to eye at bedtime. 08/25/15  Yes [provider]  Grant Ruts INHUB 100-50 MCG/DOSE AEPB Inhale 1 puff into the lungs 2 (two) times daily. 02/06/18  Yes [provider]    Allergies Patient has no known allergies.  No family history on file.  Social History Social History   Tobacco Use  . Smoking status: Never Smoker  . Smokeless tobacco: Never Used  Substance Use Topics  . Alcohol use: Not on file  . Drug use: Not on file    Review of Systems  Constitutional: No fever/chills. Eyes: No visual changes. ENT: No sore throat. Cardiovascular: Denies chest pain. Respiratory: Denies shortness of breath. Gastrointestinal: No abdominal pain.  No nausea, no vomiting.  No diarrhea.  No constipation. Genitourinary: Negative for dysuria. Musculoskeletal: Positive for right hip pain.  Negative for back pain. Skin: Negative for rash. Neurological: Negative for headaches, focal weakness or numbness.   ____________________________________________   PHYSICAL  EXAM:  VITAL SIGNS: ED Triage Vitals  Enc Vitals Group     BP 03/07/18 0421 (!) 164/124     Pulse Rate 03/07/18 0421 72     Resp 03/07/18 0421 (!) 24     Temp 03/07/18 0421 (!) 97.4 F (36.3 C)     Temp Source 03/07/18 0421 Axillary     SpO2 03/07/18 0421 98 %     Weight 03/07/18 0423 117 lb (53.1 kg)     Height 03/07/18 0423 5\' 6"  (1.676 m)     Head Circumference --      Peak Flow --       Pain Score --      Pain Loc --      Pain Edu? --      Excl. in [place]? --     Constitutional: Alert.  Frail appearing and in mild acute distress. Eyes: Conjunctivae are normal. PERRL. EOMI. Head: 1 cm superficial laceration to right eyebrow, nonbleeding. Nose: No congestion/rhinnorhea. Mouth/Throat: Mucous membranes are moist.  Oropharynx non-erythematous. Neck: No stridor.  No cervical spine tenderness to palpation. Cardiovascular: Normal rate, irregular rhythm. Grossly normal heart sounds.  Good peripheral circulation. Respiratory: Normal respiratory effort.  No retractions. Lungs CTAB. Gastrointestinal: Soft and nontender. No distention. No abdominal bruits. No CVA tenderness. Musculoskeletal: Right lower extremity shortened and externally rotated.  Hip is tender to palpation.  No pedal edema.  No joint effusions. Neurologic: Alert and oriented to self.  Normal speech and language. No gross focal neurologic deficits are appreciated.  Skin:  Skin is warm, dry and intact. No rash noted. Psychiatric: Mood and affect are normal. Speech and behavior are normal.  ____________________________________________   LABS (all labs ordered are listed, but only abnormal results are displayed)  Labs Reviewed  BASIC METABOLIC PANEL - Abnormal; Notable for the following components:      Result Value   Glucose, Bld 143 (*)    BUN 26 (*)    Creatinine, Ser 1.07 (*)    GFR calc non Af Amer 43 (*)    GFR calc Af Amer 50 (*)    All other components within normal limits  CBC - Abnormal; Notable for the following components:   WBC 17.1 (*)    All other components within normal limits  TROPONIN I  URINALYSIS, COMPLETE (UACMP) WITH MICROSCOPIC  TYPE AND SCREEN   ____________________________________________  EKG  ED ECG REPORT I, Keylin Podolsky J, the attending physician, personally viewed and interpreted this ECG.   Date: 03/07/2018  EKG Time: 0425  Rate: 70  Rhythm: atrial fibrillation, rate  70  Axis: Normal  Intervals:nonspecific intraventricular conduction delay  ST&T Change: Nonspecific  ____________________________________________  RADIOLOGY  ED MD interpretation: No acute intracranial abnormality; no acute cardiopulmonary process; right intertrochanteric hip fracture  Official radiology report(s): Ct Head Wo Contrast  Result Date: 03/07/2018 CLINICAL DATA:  84 y/o  F; fall with head injury. EXAM: CT HEAD WITHOUT CONTRAST TECHNIQUE: Contiguous axial images were obtained from the base of the skull through the vertex without intravenous contrast. COMPARISON:  None. FINDINGS: Brain: No evidence of acute infarction, hemorrhage, hydrocephalus, extra-axial collection or mass lesion/mass effect. Nonspecific foci of hypoattenuation in subcortical and periventricular white matter compatible with moderate chronic microvascular ischemic changes. Moderate diffuse brain parenchymal volume loss. Vascular: Calcific atherosclerosis of vertebral arteries and carotid siphons. Skull: Normal. Negative for fracture or focal lesion. Sinuses/Orbits: No acute finding. Other: None. IMPRESSION: 1. No acute intracranial abnormality or calvarial fracture identified. 2. Moderate chronic  microvascular ischemic changes and parenchymal volume loss of the brain. Electronically Signed   By: Kristine Garbe M.D.   On: 03/07/2018 06:44   Dg Chest Port 1 View  Result Date: 03/07/2018 CLINICAL DATA:  84 year old female with right hip fracture. Preop radiograph. EXAM: PORTABLE CHEST 1 VIEW COMPARISON:  Chest radiograph dated 09/25/2014 FINDINGS: There is cardiomegaly with increase in the size of the cardiopericardial silhouette since the prior radiograph. No vascular congestion or edema. There is no focal consolidation, pleural effusion, or pneumothorax. No acute osseous pathology. Osteopenia. Atherosclerotic calcification of the aorta. IMPRESSION: 1. No acute cardiopulmonary process. 2. Cardiomegaly.  Electronically Signed   By: Anner Crete M.D.   On: 03/07/2018 06:23   Dg Hip Unilat  With Pelvis 2-3 Views Right  Result Date: 03/07/2018 CLINICAL DATA:  84 y/o  F; fall with possible right hip fracture. EXAM: DG HIP (WITH OR WITHOUT PELVIS) 2-3V RIGHT COMPARISON:  None. FINDINGS: Acute right intertrochanteric hip fracture with coxa vara deformity. No hip joint dislocation. No pelvic fracture or diastasis identified. No proximal left hip fracture or dislocation. Vascular calcifications noted. IMPRESSION: Acute right intertrochanteric hip fracture with coxa vara deformity. Electronically Signed   By: Kristine Garbe M.D.   On: 03/07/2018 06:20    ____________________________________________   PROCEDURES  Procedure(s) performed: None  Procedures  Critical Care performed: No  ____________________________________________   INITIAL IMPRESSION / ASSESSMENT AND PLAN / ED COURSE  As part of my medical decision making, I reviewed the following data within the [place] History obtained from family, Nursing notes reviewed and incorporated, Labs reviewed, Radiograph reviewed , Discussed with admitting physician, A consult was requested and obtained from this/these consultant(s) Orthopedics and Notes from prior ED visits   84 year old female who presents status post fall with eyebrow laceration and right hip deformity concerning for fracture.  Will obtain CT head and plain film x-rays.  Face repaired using Dermabond and Steri-Strips.  Daughter-in-law is a former Therapist, sports and is not sure they would want patient to have surgery.   Clinical Course as of Mar 08 647  Tue Mar 07, 2018  0647 Updated patient and family of CT head results.  Discussed with hospitalist who will evaluate patient in the emergency department for admission.   [JS]    Clinical Course User Index [JS] Paulette Blanch, MD     ____________________________________________   FINAL CLINICAL  IMPRESSION(S) / ED DIAGNOSES  Final diagnoses:  Fall, initial encounter  Facial laceration, initial encounter  Closed right hip fracture, initial encounter [facility])     ED Discharge Orders    None       Note:  This document was prepared using Dragon voice recognition software and may include unintentional dictation errors.    Paulette Blanch, MD 03/07/18 [phone]

## 2018-03-07 NOTE — Anesthesia Procedure Notes (Addendum)
Spinal  Patient location during procedure: OR Start time: 03/07/2018 3:52 PM End time: 03/07/2018 3:54 PM Staffing Anesthesiologist: Piscitello, Precious Haws, MD Resident/CRNA: Allean Found, CRNA Performed: resident/CRNA  Preanesthetic Checklist Completed: patient identified, site marked, surgical consent, pre-op evaluation, timeout performed, IV checked, risks and benefits discussed and monitors and equipment checked Spinal Block Patient position: sitting Prep: ChloraPrep Patient monitoring: heart rate, continuous pulse ox, blood pressure and cardiac monitor Approach: midline Location: L3-4 Injection technique: single-shot Needle Needle type: [place] and Introducer  Needle gauge: 24 G Needle length: 9 cm Assessment Sensory level: T10 Additional Notes Negative paresthesia. Negative blood return. Positive free-flowing CSF. Expiration date of kit checked and confirmed. Patient tolerated procedure well, without complications.

## 2018-03-07 NOTE — ED Triage Notes (Addendum)
Pt arrives to ED from home via [facility] s/p fall with head injury and possible RIGHT hip fracture. EMS reports pt was on floor when they arrived to scene, pt unable to verbalize situation surrounding fall. Pt also has a 1" laceration above the RIGHT eye. EMS reports pt with obvious shortening and rotation to RIGHT leg. EMS reports pt has been unable to answer any questions other than her name. Family at bedside at this time to prove information and medical h/x.

## 2018-03-07 NOTE — Consult Note (Signed)
ORTHOPAEDIC CONSULTATION  REQUESTING PHYSICIAN: Demetrios Loll, MD  Chief Complaint: right hip pain  HPI: Gina Blankenship is a 84 y.o. female who complains of right hip pain. Please see H&P and ED notes for details. She has no other complaints. The history is primarily from her daughter in-law and son.  Past Medical History:  Diagnosis Date  . A-fib ([place])   . COPD (chronic obstructive pulmonary disease) ([place])   . COPD (chronic obstructive pulmonary disease) ([place])   . Diverticulosis   . Hypertension   . Mitral valve prolapse   . Osteoporosis    Past Surgical History:  Procedure Laterality Date  . CATARACT EXTRACTION    . CHOLECYSTECTOMY     Social History   Socioeconomic History  . Marital status: Widowed    Spouse name: Not on file  . Number of children: Not on file  . Years of education: Not on file  . Highest education level: Not on file  Occupational History  . Not on file  Social Needs  . Financial resource strain: Not on file  . Food insecurity:    Worry: Not on file    Inability: Not on file  . Transportation needs:    Medical: Not on file    Non-medical: Not on file  Tobacco Use  . Smoking status: Never Smoker  . Smokeless tobacco: Never Used  Substance and Sexual Activity  . Alcohol use: Not on file  . Drug use: Not on file  . Sexual activity: Not on file  Lifestyle  . Physical activity:    Days per week: Not on file    Minutes per session: Not on file  . Stress: Not on file  Relationships  . Social connections:    Talks on phone: Not on file    Gets together: Not on file    Attends religious service: Not on file    Active member of club or organization: Not on file    Attends meetings of clubs or organizations: Not on file    Relationship status: Not on file  Other Topics Concern  . Not on file  Social History Narrative  . Not on file   Family History  Problem Relation Age of Onset  . Pancreatic cancer Mother   . Emphysema Sister    No  Known Allergies Prior to Admission medications   Medication Sig Start Date End Date Taking? Authorizing Provider  acetaminophen (TYLENOL) 325 MG tablet Take 650 mg by mouth every 4 (four) hours as needed for pain.   Yes [provider]  aspirin EC 81 MG tablet Take 81 mg by mouth at bedtime.    Yes [provider]  Calcium Carbonate-Vitamin D (OYSTER SHELL CALCIUM 500 + D) 500-125 MG-UNIT TABS Take 1 tablet by mouth 2 (two) times daily.   Yes [provider]  fluticasone (FLONASE) 50 MCG/ACT nasal spray Place 2 sprays into the nose daily.   Yes [provider]  Melatonin 5 MG TABS Take 5 mg by mouth at bedtime as needed for sleep.   Yes [provider]  metoprolol tartrate (LOPRESSOR) 25 MG tablet Take 25 mg by mouth 2 (two) times daily. 02/06/18  Yes [provider]  Multiple Vitamin (MULTI-VITAMINS) TABS Take 1 tablet by mouth daily.   Yes [provider]  Travoprost, BAK Free, (TRAVATAN Z) 0.004 % SOLN ophthalmic solution Apply 1 drop to eye at bedtime. 08/25/15  Yes [provider]  Grant Ruts INHUB 100-50 MCG/DOSE AEPB  Inhale 1 puff into the lungs 2 (two) times daily. 02/06/18  Yes [provider]   Ct Head Wo Contrast  Result Date: 03/07/2018 CLINICAL DATA:  84 y/o  F; fall with head injury. EXAM: CT HEAD WITHOUT CONTRAST TECHNIQUE: Contiguous axial images were obtained from the base of the skull through the vertex without intravenous contrast. COMPARISON:  None. FINDINGS: Brain: No evidence of acute infarction, hemorrhage, hydrocephalus, extra-axial collection or mass lesion/mass effect. Nonspecific foci of hypoattenuation in subcortical and periventricular white matter compatible with moderate chronic microvascular ischemic changes. Moderate diffuse brain parenchymal volume loss. Vascular: Calcific atherosclerosis of vertebral arteries and carotid siphons. Skull: Normal. Negative for fracture or focal lesion.  Sinuses/Orbits: No acute finding. Other: None. IMPRESSION: 1. No acute intracranial abnormality or calvarial fracture identified. 2. Moderate chronic microvascular ischemic changes and parenchymal volume loss of the brain. Electronically Signed   By: Kristine Garbe M.D.   On: 03/07/2018 06:44   Dg Chest Port 1 View  Result Date: 03/07/2018 CLINICAL DATA:  84 year old female with right hip fracture. Preop radiograph. EXAM: PORTABLE CHEST 1 VIEW COMPARISON:  Chest radiograph dated 09/25/2014 FINDINGS: There is cardiomegaly with increase in the size of the cardiopericardial silhouette since the prior radiograph. No vascular congestion or edema. There is no focal consolidation, pleural effusion, or pneumothorax. No acute osseous pathology. Osteopenia. Atherosclerotic calcification of the aorta. IMPRESSION: 1. No acute cardiopulmonary process. 2. Cardiomegaly. Electronically Signed   By: Anner Crete M.D.   On: 03/07/2018 06:23   Dg Hip Unilat  With Pelvis 2-3 Views Right  Result Date: 03/07/2018 CLINICAL DATA:  84 y/o  F; fall with possible right hip fracture. EXAM: DG HIP (WITH OR WITHOUT PELVIS) 2-3V RIGHT COMPARISON:  None. FINDINGS: Acute right intertrochanteric hip fracture with coxa vara deformity. No hip joint dislocation. No pelvic fracture or diastasis identified. No proximal left hip fracture or dislocation. Vascular calcifications noted. IMPRESSION: Acute right intertrochanteric hip fracture with coxa vara deformity. Electronically Signed   By: Kristine Garbe M.D.   On: 03/07/2018 06:20    Positive ROS: All other systems have been reviewed and were otherwise negative with the exception of those mentioned in the HPI and as above.  Physical Exam: General: Alert, no acute distress Cardiovascular: No pedal edema Respiratory: No cyanosis, no use of accessory musculature GI: No organomegaly, abdomen is soft and non-tender Skin: No lesions in the area of chief  complaint Neurologic: Sensation intact distally Psychiatric: Patient is competent for consent with normal mood and affect Lymphatic: No axillary or cervical lymphadenopathy  MUSCULOSKELETAL: right lower extremity short, externally rotated, PF/DF/EHL intact, good cap refill  Assessment: Right hip intertrochanteric fracture  Plan: Plan a right hip trochanteric femoral nail.   The diagnosis, risks, benefits and alternatives to treatment are all discussed in detail with the patient and family. Risks include but are not limited to bleeding, infection, deep vein thrombosis, pulmonary embolism, nerve or vascular injury, non-union, repeat operation, persistent pain, weakness, stiffness and death. She understands and is eager to proceed.     Lovell Sheehan, MD    03/07/2018 3:17 PM

## 2018-03-07 NOTE — Progress Notes (Signed)
Notified Dr. Theodore Demark of difficulty assessing Dermatone level due to patient's cognition.  Patient responds to voice and is able to move feet. Per Dr. Theodore Demark, acceptable to transport to floor.

## 2018-03-07 NOTE — Anesthesia Preprocedure Evaluation (Signed)
Anesthesia Evaluation  Patient identified by MRN, date of birth, ID band Patient awake and Patient confused    Reviewed: Allergy & Precautions, H&P , NPO status , Patient's Chart, lab work & pertinent test results  Airway Mallampati: III  TM Distance: <3 FB Neck ROM: limited    Dental  (+) Chipped, Poor Dentition, Missing   Pulmonary COPD,           Cardiovascular Exercise Tolerance: Good hypertension, (-) angina(-) Past MI + dysrhythmias Atrial Fibrillation      Neuro/Psych negative neurological ROS  negative psych ROS   GI/Hepatic negative GI ROS, Neg liver ROS, neg GERD  ,  Endo/Other  negative endocrine ROS  Renal/GU      Musculoskeletal   Abdominal   Peds  Hematology negative hematology ROS (+)   Anesthesia Other Findings Past Medical History: No date: A-fib (HCC) No date: COPD (chronic obstructive pulmonary disease) (HCC) No date: COPD (chronic obstructive pulmonary disease) (HCC) No date: Diverticulosis No date: Hypertension No date: Mitral valve prolapse No date: Osteoporosis  Past Surgical History: No date: CATARACT EXTRACTION No date: CHOLECYSTECTOMY  BMI    Body Mass Index:  18.88 kg/m      Reproductive/Obstetrics negative OB ROS                             Anesthesia Physical Anesthesia Plan  ASA: III  Anesthesia Plan: Spinal   Post-op Pain Management:    Induction:   PONV Risk Score and Plan:   Airway Management Planned: Natural Airway and Nasal Cannula  Additional Equipment:   Intra-op Plan:   Post-operative Plan:   Informed Consent: I have reviewed the patients History and Physical, chart, labs and discussed the procedure including the risks, benefits and alternatives for the proposed anesthesia with the patient or authorized representative who has indicated his/her understanding and acceptance.   Dental Advisory Given  Plan Discussed with:  Anesthesiologist, CRNA and Surgeon  Anesthesia Plan Comments: (Plan to suspend DNR for procedure.  Patient and family voiced understanding.  Family reports no bleeding problems and no anticoagulant use.  Plan for spinal with backup [place]  Family consented for risks of anesthesia including but not limited to:  - adverse reactions to medications - risk of bleeding, infection, nerve damage and headache - risk of failed spinal - damage to teeth, lips or other oral mucosa - sore throat or hoarseness - Damage to heart, brain, lungs or loss of life  Family voiced understanding.)        Anesthesia Quick Evaluation

## 2018-03-07 NOTE — ED Notes (Signed)
Pt taken to floor on stretcher. VSS. NAD. Report called to [facility]

## 2018-03-07 NOTE — Anesthesia Procedure Notes (Signed)
Date/Time: 03/07/2018 3:50 PM Performed by: Allean Found, CRNA Pre-anesthesia Checklist: Patient identified, Emergency Drugs available, Suction available, Patient being monitored and Timeout performed Patient Re-evaluated:Patient Re-evaluated prior to induction Oxygen Delivery Method: Nasal cannula Placement Confirmation: positive ETCO2

## 2018-03-07 NOTE — Anesthesia Post-op Follow-up Note (Signed)
Anesthesia QCDR form completed.        

## 2018-03-08 ENCOUNTER — Encounter: Payer: Self-pay | Admitting: Orthopedic Surgery

## 2018-03-08 DIAGNOSIS — L8990 Pressure ulcer of unspecified site, unspecified stage: Secondary | ICD-10-CM

## 2018-03-08 LAB — COMPREHENSIVE METABOLIC PANEL
ALK PHOS: 77 U/L (ref 38–126)
ALT: 20 U/L (ref 14–54)
AST: 40 U/L (ref 15–41)
Albumin: 3.4 g/dL — ABNORMAL LOW (ref 3.5–5.0)
Anion gap: 8 (ref 5–15)
BUN: 28 mg/dL — AB (ref 6–20)
CALCIUM: 7.9 mg/dL — AB (ref 8.9–10.3)
CHLORIDE: 107 mmol/L (ref 101–111)
CO2: 21 mmol/L — AB (ref 22–32)
CREATININE: 0.81 mg/dL (ref 0.44–1.00)
GFR calc non Af Amer: 60 mL/min — ABNORMAL LOW (ref >60)
Glucose, Bld: 157 mg/dL — ABNORMAL HIGH (ref 65–99)
Potassium: 4.1 mmol/L (ref 3.5–5.1)
SODIUM: 136 mmol/L (ref 135–145)
Total Bilirubin: 2.9 mg/dL — ABNORMAL HIGH (ref 0.3–1.2)
Total Protein: 5.9 g/dL — ABNORMAL LOW (ref 6.5–8.1)

## 2018-03-08 LAB — URINALYSIS, COMPLETE (UACMP) WITH MICROSCOPIC
Bacteria, UA: NONE SEEN
Bilirubin Urine: NEGATIVE
GLUCOSE, UA: NEGATIVE mg/dL
Hgb urine dipstick: NEGATIVE
KETONES UR: 5 mg/dL — AB
Nitrite: NEGATIVE
Protein, ur: NEGATIVE mg/dL
Specific Gravity, Urine: 1.029 (ref 1.005–1.030)
pH: 5 (ref 5.0–8.0)

## 2018-03-08 LAB — CBC
HCT: 36.5 % (ref 35.0–47.0)
Hemoglobin: 12.2 g/dL (ref 12.0–16.0)
MCH: 31.8 pg (ref 26.0–34.0)
MCHC: 33.4 g/dL (ref 32.0–36.0)
MCV: 95.5 fL (ref 80.0–100.0)
PLATELETS: 229 10*3/uL (ref 150–440)
RBC: 3.82 MIL/uL (ref 3.80–5.20)
RDW: 13.6 % (ref 11.5–14.5)
WBC: 18.2 10*3/uL — ABNORMAL HIGH (ref 3.6–11.0)

## 2018-03-08 MED ORDER — ENSURE ENLIVE PO LIQD
237.0000 mL | Freq: Two times a day (BID) | ORAL | Status: DC
  Administered 2018-03-08 – 2018-03-09 (×4): 237 mL via ORAL

## 2018-03-08 MED ORDER — ENOXAPARIN SODIUM 40 MG/0.4ML ~~LOC~~ SOLN
40.0000 mg | SUBCUTANEOUS | Status: DC
  Administered 2018-03-08: 40 mg via SUBCUTANEOUS
  Filled 2018-03-08: qty 0.4

## 2018-03-08 MED ORDER — ENOXAPARIN SODIUM 30 MG/0.3ML ~~LOC~~ SOLN: 30.0000 mg | SUBCUTANEOUS | Status: DC

## 2018-03-08 MED ORDER — DOCUSATE SODIUM 100 MG PO CAPS
200.0000 mg | ORAL_CAPSULE | Freq: Two times a day (BID) | ORAL | Status: DC
  Administered 2018-03-08 – 2018-03-09 (×2): 200 mg via ORAL
  Filled 2018-03-08 (×2): qty 2

## 2018-03-08 NOTE — Progress Notes (Signed)
Per [facility] admissions coordinator at [facility] authorization has been received. Plan is for patient to D/C to [facility] tomorrow pending medical clearance. Patient's daughter in law Britt Boozer is aware of above.   [employer], LCSW [phone]

## 2018-03-08 NOTE — Progress Notes (Signed)
Physical Therapy Treatment Patient Details Name: Gina Blankenship MRN: [id number] DOB: Apr 12, 1921 Today's Date: 03/08/2018    History of Present Illness presented to ER secondary to fall in home environment, hit head and experienced acute R hip pain; admitted with R intertrochanteric hip fracture, status post IM nailing (5/7), WBAT.    PT Comments    Gradual increase in gait distance and overall activity tolerance, but continues to require constant hands-on assist for all functional activities.  Limited stance time/weight acceptance R LE (due to pain) and poor standing balance noted.  Poor carry-over of new information; significant difficulty with isolated LE therex, functional activities due to [facility], cognitive deficits.    Follow Up Recommendations  SNF     Equipment Recommendations  Rolling walker with 5" wheels    Recommendations for Other Services       Precautions / Restrictions Precautions Precautions: Fall Restrictions Weight Bearing Restrictions: Yes RLE Weight Bearing: Weight bearing as tolerated    Mobility  Bed Mobility Overal bed mobility: Needs Assistance Bed Mobility: Sit to Supine       Sit to supine: Mod assist;+2 for safety/equipment      Transfers Overall transfer level: Needs assistance Equipment used: Rolling walker (2 wheeled) Transfers: Sit to/from Stand Sit to Stand: Min assist;Mod assist         General transfer comment: limited carry-over of hand placement and safety, but improved comfort/confidence with mvoement  Ambulation/Gait Ambulation/Gait assistance: Mod assist;+2 safety/equipment Ambulation Distance (Feet): 10 Feet Assistive device: Rolling walker (2 wheeled)       General Gait Details: 3-point, step to gait pattern; narrowed BOS with poor R LE weight shift, stance time; poor balance. Distance limited by pain   Stairs             Wheelchair Mobility    Modified Rankin (Stroke Patients Only)       Balance  Overall balance assessment: Needs assistance Sitting-balance support: No upper extremity supported;Feet supported Sitting balance-Leahy Scale: Fair                                      Cognition Arousal/Alertness: Awake/alert Behavior During Therapy: WFL for tasks assessed/performed Overall Cognitive Status: History of cognitive impairments - at baseline                                        Exercises Other Exercises Other Exercises: Sit/stand from recliner x3 with RW, min/mod assist Other Exercises: Chair/bed transfer with RW, min/mod assist; hand-over-hand for walker management, balance and overall safety    General Comments        Pertinent Vitals/Pain Pain Assessment: Faces Faces Pain Scale: Hurts little more Pain Location: R hip Pain Descriptors / Indicators: Aching;Grimacing;Guarding Pain Intervention(s): Limited activity within patient's tolerance;Premedicated before session;Repositioned    Home Living                      Prior Function            PT Goals (current goals can now be found in the care plan section) Acute Rehab PT Goals Patient Stated Goal: to have someone help me to go the bathroom PT Goal Formulation: With patient/family Time For Goal Achievement: 03/22/18 Potential to Achieve Goals: Fair Progress towards PT goals: Progressing toward goals  Frequency    BID      PT Plan Current plan remains appropriate    Co-evaluation              AM-PAC PT "6 Clicks" Daily Activity  Outcome Measure  Difficulty turning over in bed (including adjusting bedclothes, sheets and blankets)?: Unable Difficulty moving from lying on back to sitting on the side of the bed? : Unable Difficulty sitting down on and standing up from a chair with arms (e.g., wheelchair, bedside commode, etc,.)?: Unable Help needed moving to and from a bed to chair (including a wheelchair)?: A Little Help needed walking in hospital  room?: A Lot Help needed climbing 3-5 steps with a railing? : Total 6 Click Score: 9    End of Session Equipment Utilized During Treatment: Gait belt Activity Tolerance: Patient limited by pain Patient left: in bed;with bed alarm set;with family/visitor present Nurse Communication: Mobility status PT Visit Diagnosis: Muscle weakness (generalized) (M62.81);Difficulty in walking, not elsewhere classified (R26.2);Pain Pain - Right/Left: Right Pain - part of body: Hip     Time: 7903-8333 PT Time Calculation (min) (ACUTE ONLY): 20 min  Charges:  $Gait Training: 8-22 mins                    G Codes:       Mayrene Bastarache H. Owens Shark, PT, DPT, NCS 03/08/18, 11:04 PM [phone]

## 2018-03-08 NOTE — Evaluation (Signed)
Physical Therapy Evaluation Patient Details Name: IXCHEL DUCK MRN: [id number] DOB: December 08, 1920 Today's Date: 03/08/2018   History of Present Illness  presented to ER secondary to fall in home environment, hit head and experienced acute R hip pain; admitted with R intertrochanteric hip fracture, status post IM nailing (5/7), WBAT.  Clinical Impression  Upon evaluation, patient alert and oriented to self and general location; follows simple commands (When able to hear). Often requiring hand-over-hand and gestures due to profound hearing deficit.  R LE generally guarded and weak due to pain; formal MMT deferred secondary to urgent request/need for toileting.  Currently requiring mod assist for bed mobility, sit/stand, basic transfers and very short-distance gait (5') with RW.  Poor balance, weight shift and walker control; unsafe/unable to attempt without physical assist at all times. Would benefit from skilled PT to address above deficits and promote optimal return to PLOF; recommend transition to STR upon discharge from acute hospitalization, as patient unable to demonstrate ability to safely complete mobility tasks necessary to facilitate safe discharge home.     Follow Up Recommendations SNF    Equipment Recommendations  Rolling walker with 5" wheels    Recommendations for Other Services       Precautions / Restrictions Precautions Precautions: Fall Restrictions Weight Bearing Restrictions: Yes RLE Weight Bearing: Weight bearing as tolerated      Mobility  Bed Mobility Overal bed mobility: Needs Assistance Bed Mobility: Supine to Sit     Supine to sit: Mod assist        Transfers Overall transfer level: Needs assistance Equipment used: Rolling walker (2 wheeled) Transfers: Sit to/from Stand Sit to Stand: Mod assist         General transfer comment: assist for hand/foot placement, lift off and standing balance.  Maintains bilat feet anterior to BOS despite  cuing  Ambulation/Gait Ambulation/Gait assistance: Mod assist Ambulation Distance (Feet): 5 Feet(x2) Assistive device: Rolling walker (2 wheeled)       General Gait Details: very short shuffling steps; poor foot clearance.  Poor balance in all planes; mod assist for weigth shift (to initiate stepping pattern) and walker management, sequencing and overall balance/safety.  Stairs            Wheelchair Mobility    Modified Rankin (Stroke Patients Only)       Balance Overall balance assessment: Needs assistance Sitting-balance support: No upper extremity supported;Feet supported Sitting balance-Leahy Scale: Fair     Standing balance support: Bilateral upper extremity supported Standing balance-Leahy Scale: Poor                               Pertinent Vitals/Pain Pain Assessment: Faces Faces Pain Scale: Hurts even more Pain Location: R hip Pain Descriptors / Indicators: Aching;Grimacing;Guarding Pain Intervention(s): Limited activity within patient's tolerance;Monitored during session;Repositioned    Home Living Family/patient expects to be discharged to:: Skilled nursing facility Living Arrangements: Children               Additional Comments: Lives with son and daughter-in-law in two-story home, but mother-in-law suite on main level of home (no essential needs upstairs).  2-3 steps to enter home.      Prior Function Level of Independence: Independent with assistive device(s)         Comments: Sup/mod indep with RW for ADLs, household activities; family available to assist as needed.     Hand Dominance        Extremity/Trunk  Assessment   Upper Extremity Assessment Upper Extremity Assessment: Overall WFL for tasks assessed    Lower Extremity Assessment Lower Extremity Assessment: Generalized weakness(bilat LEs grossly at least 3-/5 throughout; R LE generally limited by pain.  Formal MMT/ROM grossly limited by urgent need to toilet)        Communication   Communication: HOH  Cognition Arousal/Alertness: Awake/alert Behavior During Therapy: Anxious Overall Cognitive Status: History of cognitive impairments - at baseline                                        General Comments      Exercises Other Exercises Other Exercises: Toilet transfer, SPT with RW, mod assist; sit/stand from [facility] x2 with RW, mod assist.  Hand-over-hand for UE placement during movement transitions (due to exceptionally HOH). RN present in session to manually disimpact due to significant constipation.   Assessment/Plan    PT Assessment Patient needs continued PT services  PT Problem List Decreased strength;Decreased range of motion;Decreased activity tolerance;Decreased balance;Decreased mobility;Decreased coordination;Decreased cognition;Decreased knowledge of use of DME;Decreased safety awareness;Decreased knowledge of precautions;Decreased skin integrity;Pain       PT Treatment Interventions DME instruction;Gait training;Functional mobility training;Stair training;Therapeutic activities;Therapeutic exercise;Balance training;Patient/family education    PT Goals (Current goals can be found in the Care Plan section)  Acute Rehab PT Goals Patient Stated Goal: to have someone help me to go the bathroom PT Goal Formulation: With patient/family Time For Goal Achievement: 03/22/18 Potential to Achieve Goals: Fair    Frequency BID   Barriers to discharge Decreased caregiver support      Co-evaluation               AM-PAC PT "6 Clicks" Daily Activity  Outcome Measure Difficulty turning over in bed (including adjusting bedclothes, sheets and blankets)?: Unable Difficulty moving from lying on back to sitting on the side of the bed? : Unable Difficulty sitting down on and standing up from a chair with arms (e.g., wheelchair, bedside commode, etc,.)?: Unable Help needed moving to and from a bed to chair (including a  wheelchair)?: A Lot Help needed walking in hospital room?: A Lot Help needed climbing 3-5 steps with a railing? : Total 6 Click Score: 8    End of Session Equipment Utilized During Treatment: Gait belt Activity Tolerance: Patient limited by pain Patient left: in chair;with call bell/phone within reach;with family/visitor present;with chair alarm set Nurse Communication: Mobility status PT Visit Diagnosis: Muscle weakness (generalized) (M62.81);Difficulty in walking, not elsewhere classified (R26.2);Pain Pain - Right/Left: Right Pain - part of body: Hip    Time: 1030-1110 PT Time Calculation (min) (ACUTE ONLY): 40 min   Charges:   PT Evaluation $PT Eval Moderate Complexity: 1 Mod PT Treatments $Therapeutic Activity: 23-37 mins   PT G Codes:        Maezie Justin H. Owens Shark, PT, DPT, NCS 03/08/18, 11:29 AM [phone]

## 2018-03-08 NOTE — Progress Notes (Signed)
[facility] [place] NAME: Gina Blankenship    MR#:  [id number]  DATE OF BIRTH:  07/16/1921  SUBJECTIVE:  CHIEF COMPLAINT:   Chief Complaint  Patient presents with  . Fall  . Head Injury  . Hip Injury   -Status post right hip intertrochanteric fracture repair, postop day 1 today.  Very hard of hearing. -Complains of significant constipation  REVIEW OF SYSTEMS:  Review of Systems  Constitutional: Positive for malaise/fatigue. Negative for chills and fever.  HENT: Positive for hearing loss. Negative for congestion, ear discharge and nosebleeds.   Eyes: Negative for blurred vision and double vision.  Respiratory: Negative for cough, shortness of breath and wheezing.   Cardiovascular: Negative for chest pain and palpitations.  Gastrointestinal: Positive for constipation. Negative for abdominal pain, diarrhea, nausea and vomiting.  Genitourinary: Negative for dysuria.  Musculoskeletal: Positive for joint pain and myalgias.  Neurological: Positive for weakness. Negative for dizziness, speech change, focal weakness, seizures and headaches.    DRUG ALLERGIES:  No Known Allergies  VITALS:  Blood pressure 124/67, pulse 74, temperature 98 F (36.7 C), temperature source Oral, resp. rate 16, height 5\' 6"  (1.676 m), weight 53.1 kg (117 lb), SpO2 96 %.  PHYSICAL EXAMINATION:  Physical Exam  GENERAL:  84 y.o.-year-old thin built patient lying in the bed with no acute distress.  EYES: Pupils equal, round, reactive to light and accommodation. No scleral icterus. Extraocular muscles intact.  HEENT: Head atraumatic, normocephalic. Oropharynx and nasopharynx clear.  NECK:  Supple, no jugular venous distention. No thyroid enlargement, no tenderness.  LUNGS: Normal breath sounds bilaterally, no wheezing, rales,rhonchi or crepitation. No use of accessory muscles of respiration.  Decreased bibasilar breath sounds CARDIOVASCULAR: S1, S2 normal. No   rubs, or gallops.  3/6 systolic murmur is present ABDOMEN: Soft, nontender, nondistended. Bowel sounds present. No organomegaly or mass.  EXTREMITIES: No pedal edema, cyanosis, or clubbing.  Right lateral hip dressing in place with some edema NEUROLOGIC: Cranial nerves II through XII are intact. Muscle strength 5/5 in all extremities. Sensation intact. Gait not checked.  Global weakness noted PSYCHIATRIC: The patient is alert and oriented x 3.  SKIN: No obvious rash, lesion, or ulcer.    LABORATORY PANEL:   CBC Recent Labs  Lab 03/08/18 0436  WBC 18.2*  HGB 12.2  HCT 36.5  PLT 229   ------------------------------------------------------------------------------------------------------------------  Chemistries  Recent Labs  Lab 03/08/18 0436  NA 136  K 4.1  CL 107  CO2 21*  GLUCOSE 157*  BUN 28*  CREATININE 0.81  CALCIUM 7.9*  AST 40  ALT 20  ALKPHOS 77  BILITOT 2.9*   ------------------------------------------------------------------------------------------------------------------  Cardiac Enzymes Recent Labs  Lab 03/07/18 0432  TROPONINI <0.03   ------------------------------------------------------------------------------------------------------------------  RADIOLOGY:  Ct Head Wo Contrast  Result Date: 03/07/2018 CLINICAL DATA:  84 y/o  F; fall with head injury. EXAM: CT HEAD WITHOUT CONTRAST TECHNIQUE: Contiguous axial images were obtained from the base of the skull through the vertex without intravenous contrast. COMPARISON:  None. FINDINGS: Brain: No evidence of acute infarction, hemorrhage, hydrocephalus, extra-axial collection or mass lesion/mass effect. Nonspecific foci of hypoattenuation in subcortical and periventricular white matter compatible with moderate chronic microvascular ischemic changes. Moderate diffuse brain parenchymal volume loss. Vascular: Calcific atherosclerosis of vertebral arteries and carotid siphons. Skull: Normal. Negative for fracture  or focal lesion. Sinuses/Orbits: No acute finding. Other: None. IMPRESSION: 1. No acute intracranial abnormality or calvarial fracture identified. 2. Moderate chronic microvascular ischemic  changes and parenchymal volume loss of the brain. Electronically Signed   By: Kristine Garbe M.D.   On: 03/07/2018 06:44   Dg Chest Port 1 View  Result Date: 03/07/2018 CLINICAL DATA:  84 year old female with right hip fracture. Preop radiograph. EXAM: PORTABLE CHEST 1 VIEW COMPARISON:  Chest radiograph dated 09/25/2014 FINDINGS: There is cardiomegaly with increase in the size of the cardiopericardial silhouette since the prior radiograph. No vascular congestion or edema. There is no focal consolidation, pleural effusion, or pneumothorax. No acute osseous pathology. Osteopenia. Atherosclerotic calcification of the aorta. IMPRESSION: 1. No acute cardiopulmonary process. 2. Cardiomegaly. Electronically Signed   By: Anner Crete M.D.   On: 03/07/2018 06:23   Dg Hip Operative Unilat W Or W/o Pelvis Right  Result Date: 03/07/2018 CLINICAL DATA:  Right femoral ORIF EXAM: OPERATIVE right HIP (WITH PELVIS IF PERFORMED) 3 VIEWS TECHNIQUE: Fluoroscopic spot image(s) were submitted for interpretation post-operatively. COMPARISON:  Right hip radiograph 03/07/2018 FINDINGS: Intraoperative fluoroscopy shows placement of an antegrade intramedullary nail into the right femur with interlocking component traversing the femoral neck. IMPRESSION: Intraoperative fluoroscopy. Electronically Signed   By: Ulyses Jarred M.D.   On: 03/07/2018 19:10   Dg Hip Unilat  With Pelvis 2-3 Views Right  Result Date: 03/07/2018 CLINICAL DATA:  84 y/o  F; fall with possible right hip fracture. EXAM: DG HIP (WITH OR WITHOUT PELVIS) 2-3V RIGHT COMPARISON:  None. FINDINGS: Acute right intertrochanteric hip fracture with coxa vara deformity. No hip joint dislocation. No pelvic fracture or diastasis identified. No proximal left hip fracture or  dislocation. Vascular calcifications noted. IMPRESSION: Acute right intertrochanteric hip fracture with coxa vara deformity. Electronically Signed   By: Kristine Garbe M.D.   On: 03/07/2018 06:20    EKG:   Orders placed or performed during the hospital encounter of 03/07/18  . ED EKG within 10 minutes  . ED EKG within 10 minutes  . EKG 12-Lead  . EKG 12-Lead  . EKG 12-Lead  . EKG 12-Lead    ASSESSMENT AND PLAN:   84 year old female with past medical history significant for hypertension, A. fib not on anticoagulation, COPD, osteoporosis and mitral valve prolapse presents to hospital secondary to a fall and right hip pain  1.  Right hip intertrochanteric fracture-appreciate orthopedic consult, status post repair -Stop day 1 today.  Pain control, physical therapy -Will need rehab at discharge  2.  leukocytosis-could be stress margination.  Follow-up on urine analysis  3.  Constipation-meds added  4.  Chronic A. fib-rate controlled.  Continue Lopressor.  Hold aspirin for now.  5.  DVT prophylaxis-started on Lovenox after surgery     All the records are reviewed and case discussed with Care Management/Social Workerr. Management plans discussed with the patient, family and they are in agreement.  CODE STATUS: Full code  TOTAL TIME TAKING CARE OF THIS PATIENT: 39 minutes.   POSSIBLE D/C IN 1-2 DAYS, DEPENDING ON CLINICAL CONDITION.   Gladstone Lighter M.D on 03/08/2018 at 12:31 PM  Between 7am to 6pm - Pager - [phone]  After 6pm go to [email or web address] - password [email or web address] [facility]  Office  [phone]  CC: Primary care physician; [place] Pink, MD

## 2018-03-08 NOTE — Anesthesia Postprocedure Evaluation (Signed)
Anesthesia Post Note  Patient: Gina Blankenship  Procedure(s) Performed: INTRAMEDULLARY (IM) NAIL INTERTROCHANTRIC (Right Hip)  Patient location during evaluation: Nursing Unit Anesthesia Type: Spinal Level of consciousness: oriented and awake and alert Pain management: pain level controlled Vital Signs Assessment: post-procedure vital signs reviewed and stable Respiratory status: spontaneous breathing and respiratory function stable Cardiovascular status: blood pressure returned to baseline and stable Postop Assessment: no headache, no backache and no apparent nausea or vomiting Anesthetic complications: no     Last Vitals:  Vitals:   03/07/18 2300 03/08/18 0420  BP: (!) 113/56 132/64  Pulse: 98 77  Resp: 18 18  Temp: 36.9 C 36.7 C  SpO2: 96% 94%    Last Pain:  Vitals:   03/08/18 0654  TempSrc:   PainSc: 0-No pain                 Rolla Plate P

## 2018-03-09 LAB — COMPREHENSIVE METABOLIC PANEL
ALBUMIN: 3 g/dL — AB (ref 3.5–5.0)
ALK PHOS: 71 U/L (ref 38–126)
ALT: 19 U/L (ref 14–54)
ANION GAP: 5 (ref 5–15)
AST: 28 U/L (ref 15–41)
BILIRUBIN TOTAL: 1.7 mg/dL — AB (ref 0.3–1.2)
BUN: 32 mg/dL — ABNORMAL HIGH (ref 6–20)
CALCIUM: 8 mg/dL — AB (ref 8.9–10.3)
CO2: 24 mmol/L (ref 22–32)
CREATININE: 0.58 mg/dL (ref 0.44–1.00)
Chloride: 106 mmol/L (ref 101–111)
GFR calc Af Amer: >60 mL/min (ref >60)
GLUCOSE: 118 mg/dL — AB (ref 65–99)
Potassium: 4 mmol/L (ref 3.5–5.1)
Sodium: 135 mmol/L (ref 135–145)
TOTAL PROTEIN: 5.4 g/dL — AB (ref 6.5–8.1)

## 2018-03-09 LAB — CBC
HCT: 32.6 % — ABNORMAL LOW (ref 35.0–47.0)
Hemoglobin: 10.8 g/dL — ABNORMAL LOW (ref 12.0–16.0)
MCH: 31.6 pg (ref 26.0–34.0)
MCHC: 33.2 g/dL (ref 32.0–36.0)
MCV: 95.2 fL (ref 80.0–100.0)
PLATELETS: 192 10*3/uL (ref 150–440)
RBC: 3.43 MIL/uL — ABNORMAL LOW (ref 3.80–5.20)
RDW: 13.3 % (ref 11.5–14.5)
WBC: 15.8 10*3/uL — ABNORMAL HIGH (ref 3.6–11.0)

## 2018-03-09 MED ORDER — ENOXAPARIN SODIUM 40 MG/0.4ML ~~LOC~~ SOLN: 40.0000 mg | SUBCUTANEOUS | 0 refills | Status: DC

## 2018-03-09 MED ORDER — DOCUSATE SODIUM 100 MG PO CAPS: 200.0000 mg | ORAL_CAPSULE | Freq: Two times a day (BID) | ORAL | 0 refills | Status: DC

## 2018-03-09 MED ORDER — HYDROCODONE-ACETAMINOPHEN 5-325 MG PO TABS: 1.0000 | ORAL_TABLET | Freq: Four times a day (QID) | ORAL | 0 refills | Status: DC | PRN

## 2018-03-09 MED ORDER — ENSURE ENLIVE PO LIQD: 237.0000 mL | Freq: Two times a day (BID) | ORAL | 12 refills | Status: AC

## 2018-03-09 NOTE — Progress Notes (Signed)
Report called to [facility]. Report taken by Levada Dy.  EMS called for transportation.

## 2018-03-09 NOTE — Progress Notes (Signed)
Patient is medically stable for D/C to [facility] today. Per [facility] admissions coordinator at [facility] patient can come today to room 207-A. RN will call report at [phone] and arrange EMS for transport. Clinical Education officer, museum (CSW) sent D/C orders to [employer] via [employer]. CSW contacted patient's daughter in law [name] and made her aware of above. Please reconsult if future social work needs arise. CSW signing off.   [employer], LCSW [phone]

## 2018-03-09 NOTE — Progress Notes (Signed)
  Subjective:  Patient resting comfortably.  Objective:   VITALS:   Vitals:   03/08/18 0851 03/08/18 1600 03/08/18 2328 03/09/18 0745  BP: 124/67 (!) 112/58 107/60 (!) 113/57  Pulse: 74 73 72 68  Resp: 16 18 18 18   Temp: 98 F (36.7 C) (!) 97.5 F (36.4 C) 98.2 F (36.8 C) 99 F (37.2 C)  TempSrc: Oral Oral Oral Oral  SpO2: 96% 94% 94% 95%  Weight:      Height:        PHYSICAL EXAM:  Dorsiflexion/Plantar flexion intact Incision: scant drainage Compartment soft  LABS  Results for orders placed or performed during the hospital encounter of 03/07/18 (from the past 24 hour(s))  Urinalysis, Complete w Microscopic     Status: Abnormal   Collection Time: 03/08/18  6:39 PM  Result Value Ref Range   Color, Urine AMBER (A) YELLOW   APPearance CLEAR (A) CLEAR   Specific Gravity, Urine 1.029 1.005 - 1.030   pH 5.0 5.0 - 8.0   Glucose, UA NEGATIVE NEGATIVE mg/dL   Hgb urine dipstick NEGATIVE NEGATIVE   Bilirubin Urine NEGATIVE NEGATIVE   Ketones, ur 5 (A) NEGATIVE mg/dL   Protein, ur NEGATIVE NEGATIVE mg/dL   Nitrite NEGATIVE NEGATIVE   Leukocytes, UA TRACE (A) NEGATIVE   RBC / HPF 11-20 0 - 5 RBC/hpf   WBC, UA 0-5 0 - 5 WBC/hpf   Bacteria, UA NONE SEEN NONE SEEN   Squamous Epithelial / LPF 0-5 0 - 5   Mucus PRESENT     Dg Hip Operative Unilat W Or W/o Pelvis Right  Result Date: 03/07/2018 CLINICAL DATA:  Right femoral ORIF EXAM: OPERATIVE right HIP (WITH PELVIS IF PERFORMED) 3 VIEWS TECHNIQUE: Fluoroscopic spot image(s) were submitted for interpretation post-operatively. COMPARISON:  Right hip radiograph 03/07/2018 FINDINGS: Intraoperative fluoroscopy shows placement of an antegrade intramedullary nail into the right femur with interlocking component traversing the femoral neck. IMPRESSION: Intraoperative fluoroscopy. Electronically Signed   By: Ulyses Jarred M.D.   On: 03/07/2018 19:10    Assessment/Plan: 2 Days Post-Op   Active Problems:   Closed right hip fracture  (HCC)   Pressure injury of skin   Up with therapy Discharge to SNF when medically stable [place] while at nursing facility, continue physical therapy weight bearing as tolerated, will follow-up with me in 10 to 14 days May change dressing as needed, showers are fine.    Lovell Sheehan , MD 03/09/2018, 7:47 AM

## 2018-03-09 NOTE — Discharge Summary (Signed)
[facility] NAME: Gina Blankenship    MR#:  [id number]  DATE OF BIRTH:  [birth year]  DATE OF ADMISSION:  03/07/2018   ADMITTING PHYSICIAN: Demetrios Loll, MD  DATE OF DISCHARGE: 03/09/18  PRIMARY CARE PHYSICIAN: Maryland Pink, MD   ADMISSION DIAGNOSIS:   Pain [R52] Facial laceration, initial encounter [S01.81XA] Fall, initial encounter [W19.XXXA] Closed right hip fracture, initial encounter ([place]) [S72.001A]  DISCHARGE DIAGNOSIS:   Active Problems:   Closed right hip fracture (HCC)   Pressure injury of skin   SECONDARY DIAGNOSIS:   Past Medical History:  Diagnosis Date  . A-fib ([place])   . COPD (chronic obstructive pulmonary disease) ([place])   . COPD (chronic obstructive pulmonary disease) ([place])   . Diverticulosis   . Hypertension   . Mitral valve prolapse   . Osteoporosis     HOSPITAL COURSE:   84 year old female with past medical history significant for hypertension, A. fib not on anticoagulation, COPD, osteoporosis and mitral valve prolapse presents to hospital secondary to a fall and right hip pain  1.  Right hip intertrochanteric fracture-appreciate orthopedic consult, status post repair -Post op day 2 today.  Pain control, physical therapy -Will need rehab at discharge - ice pack to the site as needed  2.  leukocytosis-could be stress margination.  negative urine analysis - no fevers, slowly improving wbcs  3.  Constipation-meds added- relieved  4.  Chronic A. fib-rate controlled.  Continue Lopressor. on aspirin at home.  5.  DVT prophylaxis- on Lovenox  X 14 days and stop  To be discharged to rehab today    DISCHARGE CONDITIONS:   Guarded  CONSULTS OBTAINED:   Orthopedics consult by Dr. Kurtis Bushman  DRUG ALLERGIES:   No Known Allergies DISCHARGE MEDICATIONS:   Allergies as of 03/09/2018   No Known Allergies     Medication List    TAKE these medications   acetaminophen 325 MG  tablet Commonly known as:  TYLENOL Take 650 mg by mouth every 4 (four) hours as needed for pain.   aspirin EC 81 MG tablet Take 81 mg by mouth at bedtime.   docusate sodium 100 MG capsule Commonly known as:  COLACE Take 2 capsules (200 mg total) by mouth 2 (two) times daily.   enoxaparin 40 MG/0.4ML injection Commonly known as:  LOVENOX Inject 0.4 mLs (40 mg total) into the skin daily for 14 days. X 14 days   feeding supplement (ENSURE ENLIVE) Liqd Take 237 mLs by mouth 2 (two) times daily between meals.   fluticasone 50 MCG/ACT nasal spray Commonly known as:  FLONASE Place 2 sprays into the nose daily.   HYDROcodone-acetaminophen 5-325 MG tablet Commonly known as:  NORCO/VICODIN Take 1 tablet by mouth every 6 (six) hours as needed for moderate pain or severe pain.   Melatonin 5 MG Tabs Take 5 mg by mouth at bedtime as needed for sleep.   metoprolol tartrate 25 MG tablet Commonly known as:  LOPRESSOR Take 25 mg by mouth 2 (two) times daily.   MULTI-VITAMINS Tabs Take 1 tablet by mouth daily.   OYSTER SHELL CALCIUM 500 + D 500-125 MG-UNIT Tabs Generic drug:  Calcium Carbonate-Vitamin D Take 1 tablet by mouth 2 (two) times daily.   TRAVATAN Z 0.004 % Soln ophthalmic solution Generic drug:  Travoprost (BAK Free) Apply 1 drop to eye at bedtime.   WIXELA INHUB 100-50 MCG/DOSE Aepb Generic drug:  Fluticasone-Salmeterol Inhale 1 puff into the lungs  2 (two) times daily.        DISCHARGE INSTRUCTIONS:   1. PCP f/u in 1-2 weeks 2. Orthopedics follow up as scheduled  DIET:   Cardiac diet  ACTIVITY:   Activity as tolerated  OXYGEN:   Home Oxygen: No.  Oxygen Delivery: room air  DISCHARGE LOCATION:   nursing home   If you experience worsening of your admission symptoms, develop shortness of breath, life threatening emergency, suicidal or homicidal thoughts you must seek medical attention immediately by calling 911 or calling your MD immediately  if  symptoms less severe.  You Must read complete instructions/literature along with all the possible adverse reactions/side effects for all the Medicines you take and that have been prescribed to you. Take any new Medicines after you have completely understood and accpet all the possible adverse reactions/side effects.   Please note  You were cared for by a hospitalist during your hospital stay. If you have any questions about your discharge medications or the care you received while you were in the hospital after you are discharged, you can call the unit and asked to speak with the hospitalist on call if the hospitalist that took care of you is not available. Once you are discharged, your primary care physician will handle any further medical issues. Please note that NO REFILLS for any discharge medications will be authorized once you are discharged, as it is imperative that you return to your primary care physician (or establish a relationship with a primary care physician if you do not have one) for your aftercare needs so that they can reassess your need for medications and monitor your lab values.    On the day of Discharge:  VITAL SIGNS:   Blood pressure 112/67, pulse 75, temperature 97.8 F (36.6 C), temperature source Oral, resp. rate 20, height 5\' 6"  (1.676 m), weight 53.1 kg (117 lb), SpO2 94 %.  PHYSICAL EXAMINATION:   GENERAL:  84 y.o.-year-old thin built patient lying in the bed with no acute distress.  EYES: Pupils equal, round, reactive to light and accommodation. No scleral icterus. Extraocular muscles intact.  HEENT: Head atraumatic, normocephalic. Oropharynx and nasopharynx clear.  NECK:  Supple, no jugular venous distention. No thyroid enlargement, no tenderness.  LUNGS: Normal breath sounds bilaterally, no wheezing, rales,rhonchi or crepitation. No use of accessory muscles of respiration.  Decreased bibasilar breath sounds CARDIOVASCULAR: S1, S2 normal. No  rubs, or gallops.   3/6 systolic murmur is present ABDOMEN: Soft, nontender, nondistended. Bowel sounds present. No organomegaly or mass.  EXTREMITIES: No pedal edema, cyanosis, or clubbing.  Right lateral hip dressing in place with some edema NEUROLOGIC: Cranial nerves II through XII are intact. Muscle strength 5/5 in all extremities. Sensation intact. Gait not checked.  Global weakness noted PSYCHIATRIC: The patient is alert and oriented x 3.  SKIN: No obvious rash, lesion, or ulcer.    DATA REVIEW:   CBC Recent Labs  Lab 03/09/18 0749  WBC 15.8*  HGB 10.8*  HCT 32.6*  PLT 192    Chemistries  Recent Labs  Lab 03/09/18 0749  NA 135  K 4.0  CL 106  CO2 24  GLUCOSE 118*  BUN 32*  CREATININE 0.58  CALCIUM 8.0*  AST 28  ALT 19  ALKPHOS 71  BILITOT 1.7*     Microbiology Results  Results for orders placed or performed during the hospital encounter of 03/07/18  MRSA PCR Screening     Status: None   Collection Time: 03/07/18  1:41 PM  Result Value Ref Range Status   MRSA by PCR NEGATIVE NEGATIVE Final    Comment:        The GeneXpert MRSA Assay (FDA approved for NASAL specimens only), is one component of a comprehensive MRSA colonization surveillance program. It is not intended to diagnose MRSA infection nor to guide or monitor treatment for MRSA infections. Performed at [facility], [address] [place], [place] [address]     RADIOLOGY:  No results found.   Management plans discussed with the patient, family and they are in agreement.  CODE STATUS:     Code Status Orders  (From admission, onward)        Start     Ordered   03/07/18 0927  Do not attempt resuscitation (DNR)  Continuous    Question Answer Comment  In the event of cardiac or respiratory ARREST Do not call a "code blue"   In the event of cardiac or respiratory ARREST Do not perform Intubation, CPR, defibrillation or ACLS   In the event of cardiac or respiratory ARREST Use medication by any  route, position, wound care, and other measures to relive pain and suffering. May use oxygen, suction and manual treatment of airway obstruction as needed for comfort.      03/07/18 0926    Code Status History    This patient has a current code status but no historical code status.    Advance Directive Documentation     Most Recent Value  Type of Advance Directive  Healthcare Power of Attorney  Pre-existing out of facility DNR order (yellow form or pink MOST form)  -  "MOST" Form in Place?  -      TOTAL TIME TAKING CARE OF THIS PATIENT: 38  minutes.    Gladstone Lighter M.D on 03/09/2018 at 12:42 PM  Between 7am to 6pm - Pager - [phone]  After 6pm go to [email or web address] - Proofreader  [facility]  Office  [phone]  CC: Primary care physician; Maryland Pink, MD   Note: This dictation was prepared with Dragon dictation along with smaller phrase technology. Any transcriptional errors that result from this process are unintentional.

## 2018-03-09 NOTE — Clinical Social Work Placement (Signed)
   CLINICAL SOCIAL WORK PLACEMENT  NOTE  Date:  03/09/2018  Patient Details  Name: Gina Blankenship MRN: [id number] Date of Birth: [birth year]  Clinical Social Work is seeking post-discharge placement for this patient at the [place] level of care (*CSW will initial, date and re-position this form in  chart as items are completed):  Yes   Patient/family provided with [place] Work Department's list of facilities offering this level of care within the geographic area requested by the patient (or if unable, by the patient's family).  Yes   Patient/family informed of their freedom to choose among providers that offer the needed level of care, that participate in Medicare, Medicaid or managed care program needed by the patient, have an available bed and are willing to accept the patient.  Yes   Patient/family informed of [facility]'s ownership interest in [facility] and [facility], as well as of the fact that they are under no obligation to receive care at these facilities.  PASRR submitted to EDS on 03/07/18     PASRR number received on 03/07/18     Existing PASRR number confirmed on       FL2 transmitted to all facilities in geographic area requested by pt/family on 03/07/18     FL2 transmitted to all facilities within larger geographic area on       Patient informed that his/her managed care company has contracts with or will negotiate with certain facilities, including the following:        Yes   Patient/family informed of bed offers received.  Patient chooses bed at [facility] )     Physician recommends and patient chooses bed at      Patient to be transferred to [facility] ) on 03/09/18.  Patient to be transferred to facility by [facility] )     Patient family notified on 03/09/18 of transfer.  Name of family member notified:  (Patient's daughter in law [name] is aware of D/C today. )     PHYSICIAN        Additional Comment:    _______________________________________________ Kody Vigil, Veronia Beets, LCSW 03/09/2018, 1:45 PM

## 2018-03-09 NOTE — Progress Notes (Signed)
Physical Therapy Treatment Patient Details Name: Gina Blankenship MRN: [id number] DOB: 1921-08-09 Today's Date: 03/09/2018    History of Present Illness presented to ER secondary to fall in home environment, hit head and experienced acute R hip pain; admitted with R intertrochanteric hip fracture, status post IM nailing (5/7), WBAT.    PT Comments    Patient with significant increase in pain to R hip this date; requiring increased assist, decreased activity progression compared to previous date.  Meds requested per therapist during session; administered per RN during session.  Patient with poor awareness of situation, location; unaware of need for pain medication/unable to indep request if not prompted (may benefit from scheduled dosing while in acute phase of recovery).    Follow Up Recommendations  SNF     Equipment Recommendations  Rolling walker with 5" wheels    Recommendations for Other Services       Precautions / Restrictions Precautions Precautions: Fall Restrictions Weight Bearing Restrictions: Yes RLE Weight Bearing: Weight bearing as tolerated    Mobility  Bed Mobility Overal bed mobility: Needs Assistance Bed Mobility: Supine to Sit     Supine to sit: Mod assist;Max assist        Transfers Overall transfer level: Needs assistance Equipment used: Rolling walker (2 wheeled) Transfers: Sit to/from [facility] Sit to Stand: Mod assist;+2 physical assistance         General transfer comment: hand-over-hand for UE/LE placement, lift off and standing balance. Poor weight acceptance R LE due to pain  Ambulation/Gait             General Gait Details: unable to tolerate due to pain   Stairs             Wheelchair Mobility    Modified Rankin (Stroke Patients Only)       Balance Overall balance assessment: Needs assistance Sitting-balance support: No upper extremity supported;Feet supported Sitting balance-Leahy Scale:  Fair     Standing balance support: Bilateral upper extremity supported Standing balance-Leahy Scale: Poor                              Cognition Arousal/Alertness: Awake/alert Behavior During Therapy: Flat affect Overall Cognitive Status: History of cognitive impairments - at baseline                                 General Comments: poor awareness of situation, location; unaware of need for pain medication/unable to indep request if not prompted (may benefit from scheduled dosing while in acute phase of recovery)      Exercises Other Exercises Other Exercises: Supine LE therex, 1x15, act assist ROM-significant guarding due to pain in R hip. Unable to actively isolate without assist from therapist. Other Exercises: Sit/stand x2 with RW, mod assist +2 for safety    General Comments        Pertinent Vitals/Pain Faces Pain Scale: Hurts whole lot Pain Location: R hip Pain Descriptors / Indicators: Aching;Grimacing;Guarding Pain Intervention(s): Limited activity within patient's tolerance;Monitored during session;Patient requesting pain meds-RN notified;RN gave pain meds during session;Repositioned    Home Living                      Prior Function            PT Goals (current goals can now be found in the care plan section)  Acute Rehab PT Goals Patient Stated Goal: to try to do what you want me to do PT Goal Formulation: With patient Time For Goal Achievement: 03/23/18 Potential to Achieve Goals: Fair Progress towards PT goals: Not progressing toward goals - comment(limited by pain this date)    Frequency    BID      PT Plan Current plan remains appropriate    Co-evaluation              AM-PAC PT "6 Clicks" Daily Activity  Outcome Measure  Difficulty turning over in bed (including adjusting bedclothes, sheets and blankets)?: Unable Difficulty moving from lying on back to sitting on the side of the bed? :  Unable Difficulty sitting down on and standing up from a chair with arms (e.g., wheelchair, bedside commode, etc,.)?: Unable Help needed moving to and from a bed to chair (including a wheelchair)?: A Lot Help needed walking in hospital room?: Total Help needed climbing 3-5 steps with a railing? : Total 6 Click Score: 7    End of Session Equipment Utilized During Treatment: Gait belt Activity Tolerance: Patient limited by pain Patient left: in chair;with call bell/phone within reach Nurse Communication: Mobility status PT Visit Diagnosis: Muscle weakness (generalized) (M62.81);Difficulty in walking, not elsewhere classified (R26.2);Pain Pain - Right/Left: Right Pain - part of body: Hip     Time: 2683-4196 PT Time Calculation (min) (ACUTE ONLY): 30 min  Charges:  $Therapeutic Exercise: 8-22 mins $Therapeutic Activity: 8-22 mins                    G Codes:       Lucette Kratz H. Owens Shark, PT, DPT, NCS 03/09/18, 9:19 AM [phone]

## 2018-03-17 ENCOUNTER — Non-Acute Institutional Stay (SKILLED_NURSING_FACILITY): Payer: Medicare Other | Admitting: Gerontology

## 2018-03-17 ENCOUNTER — Other Ambulatory Visit: Payer: Self-pay

## 2018-03-17 ENCOUNTER — Encounter: Payer: Self-pay | Admitting: Gerontology

## 2018-03-17 DIAGNOSIS — Z9889 Other specified postprocedural states: Secondary | ICD-10-CM

## 2018-03-17 DIAGNOSIS — S72001D Fracture of unspecified part of neck of right femur, subsequent encounter for closed fracture with routine healing: Secondary | ICD-10-CM | POA: Diagnosis not present

## 2018-03-17 MED ORDER — HYDROCODONE-ACETAMINOPHEN 5-325 MG PO TABS: 1.0000 | ORAL_TABLET | Freq: Four times a day (QID) | ORAL | 0 refills | Status: DC | PRN

## 2018-03-17 NOTE — Telephone Encounter (Signed)
Rx sent to [facility] phone : [phone] , fax : [phone]  

## 2018-03-17 NOTE — Progress Notes (Signed)
Location:   [place] Room Number: [place] of Service:  SNF [id number]) Provider:  Toni Arthurs, NP-C  [place] Pink, MD  Patient Care Team: [place] Pink, MD as PCP - General [facility])  Extended Emergency Contact Information Primary Emergency Contact: [place], Kraszewski Address: [address]          [place], [place] [address] Johnnette Litter of [place] Phone: [phone] Mobile Phone: [phone] Relation: Son Secondary Emergency Contact: Evetts,[name] Address: [address]          [place], [place] [address] Johnnette Litter of [employer] Phone: [phone] Relation: Relative  Code Status: DNR Goals of care: Advanced Directive information Advanced Directives 03/17/2018  Does Patient Have a Medical Advance Directive? Yes  Type of Advance Directive Out of facility DNR (pink MOST or yellow form)  Does patient want to make changes to medical advance directive? No - Patient declined  Copy of [place] in Chart? -     Chief Complaint  Patient presents with  . Medical Management of Chronic Issues    Routine Visit    HPI:  Pt is a 84 y.o. female seen today for medical management of chronic diseases. Pt was admitted to the facility for rehab following hospitalization at [facility] after fall with Right Hip Fracture and subsequent IM Nailing. Pt has been participating in PT/OT. Pt has been progressing fairly well. Pt reports still feels tired and weak. Pt reports pain is well controlled on current regimen. Appetite is good, voiding well, having regular BMs. VSS. No other complaints.      Past Medical History:  Diagnosis Date  . A-fib ([place])   . Cataract cortical, senile   . Chicken pox   . Colon adenoma    unspecified  . COPD (chronic obstructive pulmonary disease) ([place])   . Diverticulosis   . Glaucoma   . Hemorrhoids   . Hypertension   . Lumbar degenerative disc disease   . Mitral valve prolapse   . Osteoporosis    . Palpitations    Past Surgical History:  Procedure Laterality Date  . BREAST LUMPECTOMY    . CATARACT EXTRACTION Left   . CHOLECYSTECTOMY    . COLONOSCOPY  08/2000  . CORRECTION HAMMER TOE    . INTRAMEDULLARY (IM) NAIL INTERTROCHANTERIC Right 03/07/2018   Procedure: INTRAMEDULLARY (IM) NAIL INTERTROCHANTRIC;  Surgeon: Lovell Sheehan, MD;  Location: [facility] ORS;  Service: Orthopedics;  Laterality: Right;    No Known Allergies  Allergies as of 03/17/2018   No Known Allergies     Medication List        Accurate as of 03/17/18 11:20 AM. Always use your most recent med list.          acetaminophen 325 MG tablet Commonly known as:  TYLENOL Take 650 mg by mouth every 4 (four) hours as needed for pain. pain/fever- do not exceed 3062m in 24hr period   aspirin EC 81 MG tablet Take 81 mg by mouth at bedtime.   calcium carbonate 1250 (500 Ca) MG tablet Commonly known as:  OS-CAL - dosed in mg of elemental calcium Take 1 tablet by mouth daily.   Cholecalciferol 4000 units Caps Take 1 capsule by mouth daily.   enoxaparin 40 MG/0.4ML injection Commonly known as:  LOVENOX Inject 0.4 mLs (40 mg total) into the skin daily for 14 days. X 14 days   feeding supplement (ENSURE ENLIVE) Liqd Take 237 mLs by mouth 2 (two) times daily between  meals.   fluticasone 50 MCG/ACT nasal spray Commonly known as:  FLONASE Place 2 sprays into the nose daily.   Fluticasone-Salmeterol 100-50 MCG/DOSE Aepb Commonly known as:  ADVAIR Inhale 1 puff into the lungs 2 (two) times daily.   WIXELA INHUB 100-50 MCG/DOSE Aepb Generic drug:  Fluticasone-Salmeterol Inhale 1 puff into the lungs 2 (two) times daily.   HYDROcodone-acetaminophen 5-325 MG tablet Commonly known as:  NORCO/VICODIN Take 1 tablet by mouth every 6 (six) hours as needed for moderate pain or severe pain.   Melatonin 5 MG Tabs Take 5 mg by mouth at bedtime as needed for sleep.   metoprolol tartrate 25 MG tablet Commonly known  as:  LOPRESSOR Take 25 mg by mouth 2 (two) times daily.   SENIOR TABS Tabs Take 1 tablet by mouth daily.   sennosides-docusate sodium 8.6-50 MG tablet Commonly known as:  SENOKOT-S Take 2 tablets by mouth 2 (two) times daily.   TRAVATAN Z 0.004 % Soln ophthalmic solution Generic drug:  Travoprost (BAK Free) Apply 1 drop to eye at bedtime.       Review of Systems  Constitutional: Positive for fatigue. Negative for activity change, appetite change, chills, diaphoresis and fever.  HENT: Negative for congestion, mouth sores, nosebleeds, postnasal drip, sneezing, sore throat, trouble swallowing and voice change.   Respiratory: Negative for apnea, cough, choking, chest tightness, shortness of breath and wheezing.   Cardiovascular: Negative for chest pain, palpitations and leg swelling.  Gastrointestinal: Negative for abdominal distention, abdominal pain, constipation, diarrhea and nausea.  Genitourinary: Negative for difficulty urinating, dysuria, frequency and urgency.  Musculoskeletal: Positive for arthralgias (typical arthritis) and gait problem. Negative for back pain and myalgias.  Skin: Positive for wound. Negative for color change, pallor and rash.  Neurological: Positive for weakness. Negative for dizziness, tremors, syncope, speech difficulty, numbness and headaches.  Psychiatric/Behavioral: Negative for agitation and behavioral problems.  All other systems reviewed and are negative.   Immunization History  Administered Date(s) Administered  . Influenza Inj Mdck Quad Pf 07/12/2016  . Influenza Split 08/23/2014  . Pneumococcal Polysaccharide-23 08/23/2014   There are no preventive care reminders to display for this patient. No flowsheet data found. Functional Status Survey:    Vitals:   03/17/18 1056  BP: (!) 108/49  Pulse: 86  Resp: 20  Temp: 98.6 F (37 C)  TempSrc: Oral  SpO2: 97%  Weight: 122 lb 4.8 oz (55.5 kg)  Height: _0  (1.676 m)   Body mass index is  19.74 kg/m. Physical Exam  Constitutional: She is oriented to person, place, and time. Vital signs are normal. She appears well-developed and well-nourished. She is active and cooperative. She does not appear ill. No distress.  HENT:  Head: Normocephalic and atraumatic.  Mouth/Throat: Uvula is midline, oropharynx is clear and moist and mucous membranes are normal. Mucous membranes are not pale, not dry and not cyanotic.  Eyes: Pupils are equal, round, and reactive to light. Conjunctivae, EOM and lids are normal.  Neck: Trachea normal, normal range of motion and full passive range of motion without pain. Neck supple. No JVD present. No tracheal deviation, no edema and no erythema present. No thyromegaly present.  Cardiovascular: Normal rate, normal heart sounds, intact distal pulses and normal pulses. An irregular rhythm present. Exam reveals no gallop, no distant heart sounds and no friction rub.  No murmur heard. Pulses:      Dorsalis pedis pulses are 2+ on the right side, and 2+ on the left side.  No edema  Pulmonary/Chest: Effort normal and breath sounds normal. No accessory muscle usage. No respiratory distress. She has no decreased breath sounds. She has no wheezes. She has no rhonchi. She has no rales. She exhibits no tenderness.  Abdominal: Soft. Normal appearance and bowel sounds are normal. She exhibits no distension and no ascites. There is no tenderness.  Musculoskeletal: She exhibits no edema.       Right hip: She exhibits decreased range of motion, decreased strength, tenderness and laceration.  Expected osteoarthritis, stiffness; Bilateral Calves soft, supple. Negative Homan's Sign. B- pedal pulses equal  Neurological: She is alert and oriented to person, place, and time. She has normal strength.  Skin: Skin is warm and dry. Laceration noted. She is not diaphoretic. No cyanosis. No pallor. Nails show no clubbing.  Psychiatric: She has a normal mood and affect. Her speech is normal  and behavior is normal. Judgment and thought content normal. Cognition and memory are normal.  Nursing note and vitals reviewed.   Labs reviewed: Recent Labs    03/07/18 0432 03/08/18 0436 03/09/18 0749  NA 137 136 135  K 4.0 4.1 4.0  CL 101 107 106  CO2 27 21* 24  GLUCOSE 143* 157* 118*  BUN 26* 28* 32*  CREATININE 1.07* 0.81 0.58  CALCIUM 9.1 7.9* 8.0*   Recent Labs    03/08/18 0436 03/09/18 0749  AST 40 28  ALT 20 19  ALKPHOS 77 71  BILITOT 2.9* 1.7*  PROT 5.9* 5.4*  ALBUMIN 3.4* 3.0*   Recent Labs    03/07/18 0432 03/08/18 0436 03/09/18 0749  WBC 17.1* 18.2* 15.8*  HGB 15.2 12.2 10.8*  HCT 45.6 36.5 32.6*  MCV 93.8 95.5 95.2  PLT 221 229 192   Lab Results  Component Value Date   TSH 4.82 (H) 09/26/2014   No results found for: HGBA1C No results found for: CHOL, HDL, LDLCALC, LDLDIRECT, TRIG, CHOLHDL  Significant Diagnostic Results in last 30 days:  Ct Head Wo Contrast  Result Date: 03/07/2018 CLINICAL DATA:  84 y/o  F; fall with head injury. EXAM: CT HEAD WITHOUT CONTRAST TECHNIQUE: Contiguous axial images were obtained from the base of the skull through the vertex without intravenous contrast. COMPARISON:  None. FINDINGS: Brain: No evidence of acute infarction, hemorrhage, hydrocephalus, extra-axial collection or mass lesion/mass effect. Nonspecific foci of hypoattenuation in subcortical and periventricular white matter compatible with moderate chronic microvascular ischemic changes. Moderate diffuse brain parenchymal volume loss. Vascular: Calcific atherosclerosis of vertebral arteries and carotid siphons. Skull: Normal. Negative for fracture or focal lesion. Sinuses/Orbits: No acute finding. Other: None. IMPRESSION: 1. No acute intracranial abnormality or calvarial fracture identified. 2. Moderate chronic microvascular ischemic changes and parenchymal volume loss of the brain. Electronically Signed   By: Kristine Garbe M.D.   On: 03/07/2018 06:44    Dg Chest Port 1 View  Result Date: 03/07/2018 CLINICAL DATA:  84 year old female with right hip fracture. Preop radiograph. EXAM: PORTABLE CHEST 1 VIEW COMPARISON:  Chest radiograph dated 09/25/2014 FINDINGS: There is cardiomegaly with increase in the size of the cardiopericardial silhouette since the prior radiograph. No vascular congestion or edema. There is no focal consolidation, pleural effusion, or pneumothorax. No acute osseous pathology. Osteopenia. Atherosclerotic calcification of the aorta. IMPRESSION: 1. No acute cardiopulmonary process. 2. Cardiomegaly. Electronically Signed   By: Anner Crete M.D.   On: 03/07/2018 06:23   Dg Hip Operative Unilat W Or W/o Pelvis Right  Result Date: 03/07/2018 CLINICAL DATA:  Right femoral ORIF  EXAM: OPERATIVE right HIP (WITH PELVIS IF PERFORMED) 3 VIEWS TECHNIQUE: Fluoroscopic spot image(s) were submitted for interpretation post-operatively. COMPARISON:  Right hip radiograph 03/07/2018 FINDINGS: Intraoperative fluoroscopy shows placement of an antegrade intramedullary nail into the right femur with interlocking component traversing the femoral neck. IMPRESSION: Intraoperative fluoroscopy. Electronically Signed   By: Ulyses Jarred M.D.   On: 03/07/2018 19:10   Dg Hip Unilat  With Pelvis 2-3 Views Right  Result Date: 03/07/2018 CLINICAL DATA:  84 y/o  F; fall with possible right hip fracture. EXAM: DG HIP (WITH OR WITHOUT PELVIS) 2-3V RIGHT COMPARISON:  None. FINDINGS: Acute right intertrochanteric hip fracture with coxa vara deformity. No hip joint dislocation. No pelvic fracture or diastasis identified. No proximal left hip fracture or dislocation. Vascular calcifications noted. IMPRESSION: Acute right intertrochanteric hip fracture with coxa vara deformity. Electronically Signed   By: Kristine Garbe M.D.   On: 03/07/2018 06:20    Assessment/Plan  Closed fracture of right hip with routine healing, subsequent encounter  Status post hip  surgery   Continue working with PT/OT  Continue exercises as taught by PT/OT  Skin care per protocol  Continue Lovenox 40 mg SQ Q 24 hours for DVT prophylaxis  Continue Norco 5/349m 1 tablet po Q 6 hours prn pain  Follow up with Orthopedist as instructed  Labs next week  Family/ staff Communication:   Total Time:  Documentation:  Face to Face:  Family/Phone:   Labs/tests ordered:  Cbc, met c  Medication list reviewed and assessed for continued appropriateness. Monthly medication orders reviewed and signed.  SVikki Ports NP-C Geriatrics [facility] [phone]. [facility] [id number] Phone (Mon-Fri 8am-5pm):  [phone] Call:  [phone]& follow prompts after 5pm & weekends Office Phone:  [phone] Fax:  [phone]

## 2018-03-22 DIAGNOSIS — Z9889 Other specified postprocedural states: Secondary | ICD-10-CM | POA: Insufficient documentation

## 2018-03-23 ENCOUNTER — Other Ambulatory Visit
Admission: RE | Admit: 2018-03-23 | Discharge: 2018-03-23 | Disposition: A | Discharge status: Home / Self Care | Payer: Medicare Other | Source: Ambulatory Visit | Attending: Gerontology | Admitting: Gerontology

## 2018-03-23 DIAGNOSIS — X58XXXD Exposure to other specified factors, subsequent encounter: Secondary | ICD-10-CM | POA: Insufficient documentation

## 2018-03-23 DIAGNOSIS — S72141D Displaced intertrochanteric fracture of right femur, subsequent encounter for closed fracture with routine healing: Secondary | ICD-10-CM | POA: Diagnosis present

## 2018-03-23 LAB — CBC WITH DIFFERENTIAL/PLATELET
BASOS PCT: 1 %
Basophils Absolute: 0.1 10*3/uL (ref 0–0.1)
EOS PCT: 1 %
Eosinophils Absolute: 0.1 10*3/uL (ref 0–0.7)
HCT: 38 % (ref 35.0–47.0)
HEMOGLOBIN: 12.5 g/dL (ref 12.0–16.0)
Lymphocytes Relative: 14 %
Lymphs Abs: 1.7 10*3/uL (ref 1.0–3.6)
MCH: 31.8 pg (ref 26.0–34.0)
MCHC: 32.8 g/dL (ref 32.0–36.0)
MCV: 96.9 fL (ref 80.0–100.0)
Monocytes Absolute: 2 10*3/uL — ABNORMAL HIGH (ref 0.2–0.9)
Monocytes Relative: 17 %
NEUTROS PCT: 67 %
Neutro Abs: 8.1 10*3/uL — ABNORMAL HIGH (ref 1.4–6.5)
PLATELETS: 273 10*3/uL (ref 150–440)
RBC: 3.92 MIL/uL (ref 3.80–5.20)
RDW: 14.9 % — ABNORMAL HIGH (ref 11.5–14.5)
WBC: 12 10*3/uL — ABNORMAL HIGH (ref 3.6–11.0)

## 2018-03-23 LAB — URINALYSIS, COMPLETE (UACMP) WITH MICROSCOPIC
Bilirubin Urine: NEGATIVE
GLUCOSE, UA: NEGATIVE mg/dL
Hgb urine dipstick: NEGATIVE
Ketones, ur: NEGATIVE mg/dL
NITRITE: NEGATIVE
PH: 6 (ref 5.0–8.0)
Protein, ur: NEGATIVE mg/dL
Specific Gravity, Urine: 1.009 (ref 1.005–1.030)

## 2018-03-23 LAB — COMPREHENSIVE METABOLIC PANEL
ALK PHOS: 116 U/L (ref 38–126)
ALT: 21 U/L (ref 14–54)
AST: 23 U/L (ref 15–41)
Albumin: 3.4 g/dL — ABNORMAL LOW (ref 3.5–5.0)
Anion gap: 6 (ref 5–15)
BUN: 26 mg/dL — ABNORMAL HIGH (ref 6–20)
CALCIUM: 8.4 mg/dL — AB (ref 8.9–10.3)
CO2: 29 mmol/L (ref 22–32)
CREATININE: 0.59 mg/dL (ref 0.44–1.00)
Chloride: 101 mmol/L (ref 101–111)
Glucose, Bld: 88 mg/dL (ref 65–99)
Potassium: 4.2 mmol/L (ref 3.5–5.1)
Sodium: 136 mmol/L (ref 135–145)
Total Bilirubin: 2 mg/dL — ABNORMAL HIGH (ref 0.3–1.2)
Total Protein: 6.1 g/dL — ABNORMAL LOW (ref 6.5–8.1)

## 2018-03-25 LAB — URINE CULTURE

## 2018-04-01 ENCOUNTER — Encounter
Admission: RE | Admit: 2018-04-01 | Discharge: 2018-04-01 | Disposition: A | Discharge status: Home / Self Care | Payer: Medicare Other | Source: Ambulatory Visit | Attending: Internal Medicine | Admitting: Internal Medicine

## 2018-04-03 ENCOUNTER — Other Ambulatory Visit: Payer: Self-pay | Admitting: Internal Medicine

## 2018-04-03 DIAGNOSIS — M25561 Pain in right knee: Secondary | ICD-10-CM

## 2018-04-03 DIAGNOSIS — M25551 Pain in right hip: Secondary | ICD-10-CM

## 2018-04-09 ENCOUNTER — Other Ambulatory Visit: Payer: Self-pay

## 2018-04-09 ENCOUNTER — Inpatient Hospital Stay
Admission: EM | Admit: 2018-04-09 | Discharge: 2018-04-12 | DRG: 871 | Disposition: A | Discharge status: Skilled Nursing Facility | Payer: Medicare Other | Attending: Internal Medicine | Admitting: Internal Medicine

## 2018-04-09 ENCOUNTER — Inpatient Hospital Stay: Payer: Medicare Other

## 2018-04-09 ENCOUNTER — Emergency Department: Payer: Medicare Other

## 2018-04-09 ENCOUNTER — Encounter: Payer: Self-pay | Admitting: Emergency Medicine

## 2018-04-09 DIAGNOSIS — N390 Urinary tract infection, site not specified: Secondary | ICD-10-CM | POA: Diagnosis present

## 2018-04-09 DIAGNOSIS — Z825 Family history of asthma and other chronic lower respiratory diseases: Secondary | ICD-10-CM | POA: Diagnosis not present

## 2018-04-09 DIAGNOSIS — H409 Unspecified glaucoma: Secondary | ICD-10-CM | POA: Diagnosis present

## 2018-04-09 DIAGNOSIS — J189 Pneumonia, unspecified organism: Secondary | ICD-10-CM | POA: Diagnosis present

## 2018-04-09 DIAGNOSIS — Z9842 Cataract extraction status, left eye: Secondary | ICD-10-CM

## 2018-04-09 DIAGNOSIS — R52 Pain, unspecified: Secondary | ICD-10-CM

## 2018-04-09 DIAGNOSIS — I482 Chronic atrial fibrillation: Secondary | ICD-10-CM | POA: Diagnosis present

## 2018-04-09 DIAGNOSIS — H9190 Unspecified hearing loss, unspecified ear: Secondary | ICD-10-CM | POA: Diagnosis present

## 2018-04-09 DIAGNOSIS — Z83511 Family history of glaucoma: Secondary | ICD-10-CM | POA: Diagnosis not present

## 2018-04-09 DIAGNOSIS — Z800 Family history of malignant neoplasm of digestive organs: Secondary | ICD-10-CM | POA: Diagnosis not present

## 2018-04-09 DIAGNOSIS — I480 Paroxysmal atrial fibrillation: Secondary | ICD-10-CM | POA: Diagnosis present

## 2018-04-09 DIAGNOSIS — I110 Hypertensive heart disease with heart failure: Secondary | ICD-10-CM | POA: Diagnosis present

## 2018-04-09 DIAGNOSIS — Z7982 Long term (current) use of aspirin: Secondary | ICD-10-CM | POA: Diagnosis not present

## 2018-04-09 DIAGNOSIS — J440 Chronic obstructive pulmonary disease with acute lower respiratory infection: Secondary | ICD-10-CM | POA: Diagnosis present

## 2018-04-09 DIAGNOSIS — Z9049 Acquired absence of other specified parts of digestive tract: Secondary | ICD-10-CM | POA: Diagnosis not present

## 2018-04-09 DIAGNOSIS — Z9181 History of falling: Secondary | ICD-10-CM

## 2018-04-09 DIAGNOSIS — A419 Sepsis, unspecified organism: Secondary | ICD-10-CM | POA: Diagnosis present

## 2018-04-09 DIAGNOSIS — M25569 Pain in unspecified knee: Secondary | ICD-10-CM

## 2018-04-09 DIAGNOSIS — R17 Unspecified jaundice: Secondary | ICD-10-CM

## 2018-04-09 DIAGNOSIS — I5033 Acute on chronic diastolic (congestive) heart failure: Secondary | ICD-10-CM | POA: Diagnosis present

## 2018-04-09 DIAGNOSIS — Z79899 Other long term (current) drug therapy: Secondary | ICD-10-CM

## 2018-04-09 DIAGNOSIS — R7989 Other specified abnormal findings of blood chemistry: Secondary | ICD-10-CM | POA: Diagnosis present

## 2018-04-09 DIAGNOSIS — R079 Chest pain, unspecified: Secondary | ICD-10-CM

## 2018-04-09 DIAGNOSIS — M1711 Unilateral primary osteoarthritis, right knee: Secondary | ICD-10-CM | POA: Diagnosis present

## 2018-04-09 DIAGNOSIS — M810 Age-related osteoporosis without current pathological fracture: Secondary | ICD-10-CM | POA: Diagnosis present

## 2018-04-09 DIAGNOSIS — Z87310 Personal history of (healed) osteoporosis fracture: Secondary | ICD-10-CM

## 2018-04-09 DIAGNOSIS — I341 Nonrheumatic mitral (valve) prolapse: Secondary | ICD-10-CM | POA: Diagnosis present

## 2018-04-09 DIAGNOSIS — F0390 Unspecified dementia without behavioral disturbance: Secondary | ICD-10-CM | POA: Diagnosis present

## 2018-04-09 DIAGNOSIS — Z66 Do not resuscitate: Secondary | ICD-10-CM | POA: Diagnosis present

## 2018-04-09 DIAGNOSIS — I509 Heart failure, unspecified: Secondary | ICD-10-CM

## 2018-04-09 LAB — COMPREHENSIVE METABOLIC PANEL
ALBUMIN: 3.5 g/dL (ref 3.5–5.0)
ALK PHOS: 136 U/L — AB (ref 38–126)
ALT: 20 U/L (ref 14–54)
AST: 43 U/L — ABNORMAL HIGH (ref 15–41)
Anion gap: 10 (ref 5–15)
BILIRUBIN TOTAL: 2.8 mg/dL — AB (ref 0.3–1.2)
BUN: 26 mg/dL — AB (ref 6–20)
CO2: 27 mmol/L (ref 22–32)
CREATININE: 0.63 mg/dL (ref 0.44–1.00)
Calcium: 8.5 mg/dL — ABNORMAL LOW (ref 8.9–10.3)
Chloride: 96 mmol/L — ABNORMAL LOW (ref 101–111)
GFR calc Af Amer: >60 mL/min (ref >60)
GLUCOSE: 110 mg/dL — AB (ref 65–99)
POTASSIUM: 4.3 mmol/L (ref 3.5–5.1)
Sodium: 133 mmol/L — ABNORMAL LOW (ref 135–145)
TOTAL PROTEIN: 6.3 g/dL — AB (ref 6.5–8.1)

## 2018-04-09 LAB — CBC WITH DIFFERENTIAL/PLATELET
BASOS PCT: 1 %
Basophils Absolute: 0.2 10*3/uL — ABNORMAL HIGH (ref 0–0.1)
EOS PCT: 1 %
Eosinophils Absolute: 0.2 10*3/uL (ref 0–0.7)
HEMATOCRIT: 39.3 % (ref 35.0–47.0)
Hemoglobin: 13.7 g/dL (ref 12.0–16.0)
LYMPHS ABS: 3.3 10*3/uL (ref 1.0–3.6)
Lymphocytes Relative: 15 %
MCH: 32.6 pg (ref 26.0–34.0)
MCHC: 34.9 g/dL (ref 32.0–36.0)
MCV: 93.5 fL (ref 80.0–100.0)
MONOS PCT: 13 %
Monocytes Absolute: 2.9 10*3/uL — ABNORMAL HIGH (ref 0.2–0.9)
NEUTROS ABS: 15.4 10*3/uL — AB (ref 1.4–6.5)
Neutrophils Relative %: 70 %
Platelets: 374 10*3/uL (ref 150–440)
RBC: 4.21 MIL/uL (ref 3.80–5.20)
RDW: 13.7 % (ref 11.5–14.5)
WBC: 22 10*3/uL — ABNORMAL HIGH (ref 3.6–11.0)

## 2018-04-09 LAB — TROPONIN I: Troponin I: <0.03 ng/mL (ref <0.03)

## 2018-04-09 LAB — MRSA PCR SCREENING: MRSA BY PCR: NEGATIVE

## 2018-04-09 LAB — BRAIN NATRIURETIC PEPTIDE: B Natriuretic Peptide: 574 pg/mL — ABNORMAL HIGH (ref 0.0–100.0)

## 2018-04-09 MED ORDER — CALCIUM CARBONATE-VITAMIN D 500-200 MG-UNIT PO TABS
1.0000 | ORAL_TABLET | Freq: Every day | ORAL | Status: DC
  Administered 2018-04-10 – 2018-04-12 (×3): 1 via ORAL
  Filled 2018-04-09 (×3): qty 1

## 2018-04-09 MED ORDER — MOMETASONE FURO-FORMOTEROL FUM 100-5 MCG/ACT IN AERO
2.0000 | INHALATION_SPRAY | Freq: Two times a day (BID) | RESPIRATORY_TRACT | Status: DC
Start: 2018-04-09 — End: 2018-04-12
  Administered 2018-04-09 – 2018-04-12 (×5): 2 via RESPIRATORY_TRACT
  Filled 2018-04-09: qty 8.8

## 2018-04-09 MED ORDER — ENOXAPARIN SODIUM 40 MG/0.4ML ~~LOC~~ SOLN
40.0000 mg | SUBCUTANEOUS | Status: DC
Start: 2018-04-09 — End: 2018-04-12
  Administered 2018-04-09 – 2018-04-11 (×3): 40 mg via SUBCUTANEOUS
  Filled 2018-04-09 (×3): qty 0.4

## 2018-04-09 MED ORDER — ADULT MULTIVITAMIN W/MINERALS CH
1.0000 | ORAL_TABLET | Freq: Every day | ORAL | Status: DC
  Administered 2018-04-10 – 2018-04-12 (×3): 1 via ORAL
  Filled 2018-04-09 (×3): qty 1

## 2018-04-09 MED ORDER — MEMANTINE HCL 5 MG PO TABS
5.0000 mg | ORAL_TABLET | Freq: Every day | ORAL | Status: DC
Start: 2018-04-10 — End: 2018-04-12
  Administered 2018-04-10 – 2018-04-11 (×2): 5 mg via ORAL
  Filled 2018-04-09 (×3): qty 1

## 2018-04-09 MED ORDER — POTASSIUM CHLORIDE CRYS ER 20 MEQ PO TBCR
10.0000 meq | EXTENDED_RELEASE_TABLET | Freq: Every day | ORAL | Status: DC
  Administered 2018-04-10 – 2018-04-12 (×3): 10 meq via ORAL
  Filled 2018-04-09 (×3): qty 1

## 2018-04-09 MED ORDER — ENSURE ENLIVE PO LIQD
237.0000 mL | Freq: Two times a day (BID) | ORAL | Status: DC
  Administered 2018-04-10 – 2018-04-12 (×3): 237 mL via ORAL

## 2018-04-09 MED ORDER — ASPIRIN EC 81 MG PO TBEC
81.0000 mg | DELAYED_RELEASE_TABLET | Freq: Every day | ORAL | Status: DC
  Administered 2018-04-10 – 2018-04-11 (×2): 81 mg via ORAL
  Filled 2018-04-09 (×2): qty 1

## 2018-04-09 MED ORDER — MELATONIN 5 MG PO TABS
5.0000 mg | ORAL_TABLET | Freq: Every evening | ORAL | Status: DC | PRN
  Administered 2018-04-10 – 2018-04-11 (×2): 5 mg via ORAL
  Filled 2018-04-09 (×2): qty 1

## 2018-04-09 MED ORDER — FLUTICASONE PROPIONATE 50 MCG/ACT NA SUSP
2.0000 | Freq: Every day | NASAL | Status: DC
  Administered 2018-04-10 – 2018-04-12 (×2): 2 via NASAL
  Filled 2018-04-09: qty 16

## 2018-04-09 MED ORDER — IOPAMIDOL (ISOVUE-300) INJECTION 61%
75.0000 mL | Freq: Once | INTRAVENOUS | Status: AC | PRN
  Administered 2018-04-09: 75 mL via INTRAVENOUS

## 2018-04-09 MED ORDER — SENNOSIDES-DOCUSATE SODIUM 8.6-50 MG PO TABS
1.0000 | ORAL_TABLET | Freq: Two times a day (BID) | ORAL | Status: DC
  Administered 2018-04-09 – 2018-04-12 (×5): 1 via ORAL
  Filled 2018-04-09 (×5): qty 1

## 2018-04-09 MED ORDER — ASPIRIN 81 MG PO CHEW
162.0000 mg | CHEWABLE_TABLET | Freq: Once | ORAL | Status: AC
Start: 2018-04-09 — End: 2018-04-09
  Administered 2018-04-09: 162 mg via ORAL
  Filled 2018-04-09: qty 2

## 2018-04-09 MED ORDER — VITAMIN D3 25 MCG (1000 UNIT) PO TABS
4000.0000 [IU] | ORAL_TABLET | Freq: Every day | ORAL | Status: DC
  Administered 2018-04-10 – 2018-04-11 (×2): 4000 [IU] via ORAL
  Filled 2018-04-09 (×3): qty 4

## 2018-04-09 MED ORDER — FUROSEMIDE 10 MG/ML IJ SOLN: 20.0000 mg | Freq: Two times a day (BID) | INTRAMUSCULAR | Status: DC

## 2018-04-09 MED ORDER — HYDROCODONE-ACETAMINOPHEN 5-325 MG PO TABS: 1.0000 | ORAL_TABLET | Freq: Four times a day (QID) | ORAL | Status: DC | PRN

## 2018-04-09 MED ORDER — ACETAMINOPHEN 325 MG PO TABS
650.0000 mg | ORAL_TABLET | ORAL | Status: DC | PRN
Start: 2018-04-09 — End: 2018-04-12

## 2018-04-09 MED ORDER — BISACODYL 5 MG PO TBEC: 5.0000 mg | DELAYED_RELEASE_TABLET | Freq: Every day | ORAL | Status: DC | PRN

## 2018-04-09 MED ORDER — METOPROLOL TARTRATE 25 MG PO TABS
25.0000 mg | ORAL_TABLET | Freq: Two times a day (BID) | ORAL | Status: DC
  Administered 2018-04-09 – 2018-04-11 (×5): 25 mg via ORAL
  Filled 2018-04-09 (×6): qty 1

## 2018-04-09 MED ORDER — FUROSEMIDE 10 MG/ML IJ SOLN
20.0000 mg | Freq: Once | INTRAMUSCULAR | Status: AC
  Administered 2018-04-09: 20 mg via INTRAVENOUS
  Filled 2018-04-09: qty 4

## 2018-04-09 MED ORDER — ONDANSETRON HCL 4 MG PO TABS: 4.0000 mg | ORAL_TABLET | Freq: Four times a day (QID) | ORAL | Status: DC | PRN

## 2018-04-09 MED ORDER — ONDANSETRON HCL 4 MG/2ML IJ SOLN: 4.0000 mg | Freq: Four times a day (QID) | INTRAMUSCULAR | Status: DC | PRN

## 2018-04-09 MED ORDER — LATANOPROST 0.005 % OP SOLN
1.0000 [drp] | Freq: Every day | OPHTHALMIC | Status: DC
  Administered 2018-04-09 – 2018-04-11 (×3): 1 [drp] via OPHTHALMIC
  Filled 2018-04-09: qty 2.5

## 2018-04-09 NOTE — H&P (Signed)
[place] [facility] [place] NAME: Gina Blankenship    MR#:  [id number]  DATE OF BIRTH:  [birth year]  DATE OF ADMISSION:  04/09/2018  PRIMARY CARE PHYSICIAN: [place] Pink, MD   REQUESTING/REFERRING PHYSICIAN: Dr Darel Hong  CHIEF COMPLAINT:   Chief Complaint  Patient presents with  . Chest Pain    HISTORY OF PRESENT ILLNESS:  Gina Blankenship  is a 84 y.o. female with a known history of atrial fibrillation and she presents with chest pain.  The patient is a poor historian and it hard of hearing and difficult to get a history from her.  Family at the bedside stated that she complained of chest pain in the right side of her chest.  The patient currently denies chest pain.  The patient recently had a hip fracture surgery and was over at [facility] and now at the [facility] assisted [facility].  She has been having pain in her right knee and not been able to mobilize very well.  She has had a poor appetite.  In the ER, she was found to be in congestive heart failure and hospitalist services were contacted for further evaluation.  PAST MEDICAL HISTORY:   Past Medical History:  Diagnosis Date  . A-fib ([place])   . Cataract cortical, senile   . Chicken pox   . Colon adenoma    unspecified  . COPD (chronic obstructive pulmonary disease) ([place])   . Diverticulosis   . Glaucoma   . Hemorrhoids   . Hypertension   . Lumbar degenerative disc disease   . Mitral valve prolapse   . Osteoporosis   . Palpitations     PAST SURGICAL HISTORY:   Past Surgical History:  Procedure Laterality Date  . BREAST LUMPECTOMY    . CATARACT EXTRACTION Left   . CHOLECYSTECTOMY    . COLONOSCOPY  08/2000  . CORRECTION HAMMER TOE    . INTRAMEDULLARY (IM) NAIL INTERTROCHANTERIC Right 03/07/2018   Procedure: INTRAMEDULLARY (IM) NAIL INTERTROCHANTRIC;  Surgeon: Lovell Sheehan, MD;  Location: [facility] ORS;  Service: Orthopedics;  Laterality: Right;    SOCIAL HISTORY:   Social  History   Tobacco Use  . Smoking status: Never Smoker  . Smokeless tobacco: Never Used  Substance Use Topics  . Alcohol use: Not Currently    FAMILY HISTORY:   Family History  Problem Relation Age of Onset  . Pancreatic cancer Mother   . Emphysema Sister   . Glaucoma Sister     DRUG ALLERGIES:  No Known Allergies  REVIEW OF SYSTEMS:   Limited secondary to hearing issues and mild cognitive issues  CONSTITUTIONAL: No fever. EARS, NOSE, AND THROAT: positive for hearing loss RESPIRATORY: No cough, shortness of breath.  CARDIOVASCULAR:  positive for chest pain   GASTROINTESTINAL: No abdominal pain MUSCULOSKELETAL:  positive for right knee pain    MEDICATIONS AT HOME:   Prior to Admission medications   Medication Sig Start Date End Date Taking? Authorizing Provider  aspirin EC 81 MG tablet Take 81 mg by mouth at bedtime.    Yes [provider]  bisacodyl (BISACODYL) 5 MG EC tablet Take 5 mg by mouth daily as needed for moderate constipation.   Yes [provider]  calcium-vitamin D (OSCAL WITH D) 500-200 MG-UNIT tablet Take 1 tablet by mouth daily with breakfast.   Yes [provider]  Cholecalciferol 1000 units tablet Take 4,000 Units by mouth daily.    Yes [provider]  feeding supplement,  ENSURE ENLIVE, (ENSURE ENLIVE) LIQD Take 237 mLs by mouth 2 (two) times daily between meals. 03/09/18  Yes Gladstone Lighter, MD  fluticasone (FLONASE) 50 MCG/ACT nasal spray Place 2 sprays into the nose daily.   Yes [provider]  Fluticasone-Salmeterol (WIXELA INHUB) 100-50 MCG/DOSE AEPB Inhale 1 puff into the lungs 2 (two) times daily.   Yes [provider]  HYDROcodone-acetaminophen (NORCO/VICODIN) 5-325 MG tablet Take 1 tablet by mouth every 6 (six) hours as needed for moderate pain or severe pain. 03/17/18  Yes Toni Arthurs, NP  memantine (NAMENDA) 5 MG tablet Take 5 mg by mouth daily.   Yes [provider]   metoprolol tartrate (LOPRESSOR) 25 MG tablet Take 25 mg by mouth 2 (two) times daily.  02/06/18  Yes [provider]  Multiple Vitamins-Minerals (SENIOR TABS) TABS Take 1 tablet by mouth daily.   Yes [provider]  sennosides-docusate sodium (SENOKOT-S) 8.6-50 MG tablet Take 1 tablet by mouth 2 (two) times daily.    Yes [provider]  Travoprost, BAK Free, (TRAVATAN Z) 0.004 % SOLN ophthalmic solution Apply 1 drop to eye at bedtime.  08/25/15  Yes [provider]  acetaminophen (TYLENOL) 325 MG tablet Take 650 mg by mouth every 4 (four) hours as needed for pain. pain/fever- do not exceed 3000mg  in 24hr period    [provider]  enoxaparin (LOVENOX) 40 MG/0.4ML injection Inject 0.4 mLs (40 mg total) into the skin daily for 14 days. X 14 days 03/09/18 03/23/18  Gladstone Lighter, MD  Melatonin 5 MG TABS Take 5 mg by mouth at bedtime as needed for sleep.    [provider]      VITAL SIGNS:  Blood pressure (!) 133/56, pulse (!) 125, temperature 98.4 F (36.9 C), temperature source Oral, resp. rate (!) 21, height 5\' 3"  (1.6 m), weight 56.8 kg (125 lb 3.2 oz), SpO2 93 %.  PHYSICAL EXAMINATION:  GENERAL:  84 y.o.-year-old patient lying in the bed with no acute distress.  EYES: Pupils equal, round, reactive to light and accommodation. No scleral icterus.  HEENT: Head atraumatic, normocephalic. Oropharynx and nasopharynx clear.  NECK:  Supple, no jugular venous distention. No thyroid enlargement, no tenderness.  LUNGS: Normal breath sounds bilaterally, no wheezing, rales,rhonchi or crepitation. No use of accessory muscles of respiration.  CARDIOVASCULAR: S1, S2 irregularly irregular.  2 out of 6 systolic murmur.  ABDOMEN: Soft, nontender, nondistended. Bowel sounds present. No organomegaly or mass.  EXTREMITIES:  trace edema, no cyanosis, or clubbing.  NEUROLOGIC: Cranial nerves II through XII are intact. Muscle strength 5/5 in all extremities.  Sensation intact. Gait not checked.  PSYCHIATRIC: The patient is alert and answers a few simple questions.  SKIN: Bruise on left lower extremity  LABORATORY PANEL:   CBC Recent Labs  Lab 04/09/18 1433  WBC 22.0*  HGB 13.7  HCT 39.3  PLT 374   ------------------------------------------------------------------------------------------------------------------  Chemistries  Recent Labs  Lab 04/09/18 1433  NA 133*  K 4.3  CL 96*  CO2 27  GLUCOSE 110*  BUN 26*  CREATININE 0.63  CALCIUM 8.5*  AST 43*  ALT 20  ALKPHOS 136*  BILITOT 2.8*   ------------------------------------------------------------------------------------------------------------------  Cardiac Enzymes Recent Labs  Lab 04/09/18 1433  TROPONINI <0.03   ------------------------------------------------------------------------------------------------------------------  RADIOLOGY:  Ct Angio Chest Pe W/cm &/or Wo Cm  Result Date: 04/09/2018 CLINICAL DATA:  Right-sided chest pain. EXAM: CT ANGIOGRAPHY CHEST WITH CONTRAST TECHNIQUE: Multidetector CT imaging of the chest was performed using the standard  protocol during bolus administration of intravenous contrast. Multiplanar CT image reconstructions and MIPs were obtained to evaluate the vascular anatomy. CONTRAST:  72mL ISOVUE-300 IOPAMIDOL (ISOVUE-300) INJECTION 61% COMPARISON:  One-view chest x-ray 04/09/2018 FINDINGS: Cardiovascular: The heart is enlarged. Bilateral atrial enlargement is present. Atherosclerotic calcifications are present in the aorta without aneurysm. Pulmonary artery opacification is excellent. No focal filling defects are present to suggest pulmonary embolus. Mediastinum/Nodes: No significant mediastinal, hilar, or axillary adenopathy is present. The esophagus is within normal limits. The thoracic inlet is unremarkable. Lungs/Pleura: There is scarring at the lung apices bilaterally. Right greater than left pleural effusions are present. Minimal  associated airspace disease likely reflects atelectasis. No significant consolidation is present. Upper Abdomen: Splenic calcifications are present. Atherosclerotic disease is noted in the abdominal aorta. Limited imaging of the upper abdomen is otherwise unremarkable. Musculoskeletal: Thoracic kyphosis is present. Vertebral body heights and alignment are maintained. Chronic endplate degenerative changes are present in the lower cervical spine. No focal lytic or blastic lesions are present. The ribs are unremarkable. Sternum is within normal limits. Review of the MIP images confirms the above findings. IMPRESSION: 1. No pulmonary embolus. 2.  Aortic Atherosclerosis (ICD10-I70.0). 3. Cardiomegaly atrial enlargement bilaterally. 4. Right greater than left pleural effusions. 5. Associated airspace disease is most consistent with atelectasis. No significant consolidation is present to suggest infection. Electronically Signed   By: San Morelle M.D.   On: 04/09/2018 16:04   Dg Chest Port 1 View  Result Date: 04/09/2018 CLINICAL DATA:  New onset chest pain. EXAM: PORTABLE CHEST 1 VIEW COMPARISON:  One-view chest x-ray 03/07/2018 FINDINGS: The heart is enlarged. Mild pulmonary vascular congestion is present. New bilateral pleural effusions and basilar airspace disease present the fields are clear. Degenerative changes are noted at the shoulders. IMPRESSION: 1. Cardiomegaly with mild pulmonary vascular congestion. 2. New bilateral pleural effusions and basilar airspace disease. While this may be related to congestive heart failure, infection is not excluded. Electronically Signed   By: San Morelle M.D.   On: 04/09/2018 15:01    EKG:   Atrial fibrillation 73 bpm.  IMPRESSION AND PLAN:   1.  Acute congestive heart failure.  Echocardiogram to determine ejection fraction.  Last echocardiogram back in 2015 showed normal ejection fraction.  Lasix 20 mg IV ordered.  Patient already on metoprolol. 2.   Atrial fibrillation on aspirin for anticoagulation and metoprolol for rate control. 3.  Leukocytosis I ordered a urinalysis and a urine culture.  CT scan mentioned that the consolidation is likely atelectasis not pneumonia.  We will try to hold off on antibiotics until urine analysis obtained. 4.  Elevated bilirubin on the last couple of laboratory data.  We will get an ultrasound of the right upper quadrant for further evaluation 5.  History of COPD continue inhalers 6.  glaucoma continue eyedrops 7.  Essential hypertension continue metoprolol 8.  Right knee pain.  Family requesting to see Dr. Harlow Mares.  I will get an ultrasound of the right lower extremity rule out DVT.  All the records are reviewed and case discussed with ED provider. Management plans discussed with the patient, family and they are in agreement.  CODE STATUS: DNR  TOTAL TIME TAKING CARE OF THIS PATIENT: 50 minutes.    Loletha Grayer M.D on 04/09/2018 at 5:51 PM  Between 7am to 6pm - Pager - [phone]  After 6pm call admission pager [phone]  [facility] Office  [phone]  CC: Primary care physician; [place] Pink, MD

## 2018-04-09 NOTE — ED Triage Notes (Signed)
Pt arrived via EMS from [place] with reports of chest pain which she describes as a dull pain above the right breast. Pt denies any shortness of breath or dizziness.  Pt has hx of afib that is controlled at this time.

## 2018-04-09 NOTE — ED Notes (Signed)
Family to desk states pt is c/o right chest pain.  Dr. Mable Paris in room to assess her.

## 2018-04-09 NOTE — ED Notes (Signed)
Attending Provider at bedside. 

## 2018-04-09 NOTE — ED Notes (Signed)
Returned from CT, Dr. Mable Paris in room to talk to family prior to patient returning from [place]

## 2018-04-09 NOTE — Progress Notes (Signed)
Patient ID: Gina Blankenship, female   DOB: [birth year], 84 y.o.   MRN: [id number]  ACP note  Family at the bedside.  Patient unable to participate in this conversation.  Diagnosis: Acute congestive heart failure, atrial fibrillation, leukocytosis, elevated bilirubin, history of COPD, glaucoma, essential hypertension, right knee pain.  CODE STATUS discussed and DNR confirmed.  Plan.  Admit to the hospital for diuresis with IV Lasix.  Watch creatinine closely.  Continue metoprolol.  Cardiology consultation.  Patient not walking very well with her recent hip fracture surgery now having right knee pain.  Will get an x-ray of the right knee and ultrasound to rule out DVT.  Family requesting to see Dr. Harlow Mares orthopedic surgery.  Time spent on ACP discussion 17 minutes  Dr. Loletha Grayer

## 2018-04-09 NOTE — ED Provider Notes (Signed)
[facility] Emergency Department Provider Note  ____________________________________________   First MD Initiated Contact with Patient 04/09/18 1429     (approximate)  I have reviewed the triage vital signs and the nursing notes.   HISTORY  Chief Complaint Chest Pain   HPI Gina Blankenship is a 84 y.o. female comes to the emergency department by EMS from her assisted living with sharp right-sided chest pain for the past day or so.  Symptoms came on insidiously and have been mostly constant.  There is somewhat worse with deep inspiration and twisting and movement.  She has a long-standing history of atrial fibrillation and she had a hip replacement about 2 months ago.  No known history of DVT or pulmonary embolism.  She denies fevers or chills.  She does have a dry cough.  She has no history of CHF.  No known history of coronary artery disease.  Her symptoms are currently moderate severity.   Past Medical History:  Diagnosis Date  . A-fib ([place])   . Cataract cortical, senile   . Chicken pox   . Colon adenoma    unspecified  . COPD (chronic obstructive pulmonary disease) ([place])   . Diverticulosis   . Glaucoma   . Hemorrhoids   . Hypertension   . Lumbar degenerative disc disease   . Mitral valve prolapse   . Osteoporosis   . Palpitations     Patient Active Problem List   Diagnosis Date Noted  . CHF (congestive heart failure) ([place]) 04/09/2018  . Status post hip surgery 03/22/2018  . Pressure injury of skin 03/08/2018  . Closed right hip fracture ([place]) 03/07/2018    Past Surgical History:  Procedure Laterality Date  . BREAST LUMPECTOMY    . CATARACT EXTRACTION Left   . CHOLECYSTECTOMY    . COLONOSCOPY  08/2000  . CORRECTION HAMMER TOE    . INTRAMEDULLARY (IM) NAIL INTERTROCHANTERIC Right 03/07/2018   Procedure: INTRAMEDULLARY (IM) NAIL INTERTROCHANTRIC;  Surgeon: Lovell Sheehan, MD;  Location: [facility] ORS;  Service: Orthopedics;  Laterality:  Right;    Prior to Admission medications   Medication Sig Start Date End Date Taking? Authorizing Provider  aspirin EC 81 MG tablet Take 81 mg by mouth at bedtime.    Yes [provider]  bisacodyl (BISACODYL) 5 MG EC tablet Take 5 mg by mouth daily as needed for moderate constipation.   Yes [provider]  calcium-vitamin D (OSCAL WITH D) 500-200 MG-UNIT tablet Take 1 tablet by mouth daily with breakfast.   Yes [provider]  Cholecalciferol 1000 units tablet Take 4,000 Units by mouth daily.    Yes [provider]  feeding supplement, ENSURE ENLIVE, (ENSURE ENLIVE) LIQD Take 237 mLs by mouth 2 (two) times daily between meals. 03/09/18  Yes Gladstone Lighter, MD  fluticasone (FLONASE) 50 MCG/ACT nasal spray Place 2 sprays into the nose daily.   Yes [provider]  Fluticasone-Salmeterol (WIXELA INHUB) 100-50 MCG/DOSE AEPB Inhale 1 puff into the lungs 2 (two) times daily.   Yes [provider]  HYDROcodone-acetaminophen (NORCO/VICODIN) 5-325 MG tablet Take 1 tablet by mouth every 6 (six) hours as needed for moderate pain or severe pain. 03/17/18  Yes Toni Arthurs, NP  memantine (NAMENDA) 5 MG tablet Take 5 mg by mouth daily.   Yes [provider]  metoprolol tartrate (LOPRESSOR) 25 MG tablet Take 25 mg by mouth 2 (two) times daily.  02/06/18  Yes [provider]  Multiple Vitamins-Minerals (SENIOR  TABS) TABS Take 1 tablet by mouth daily.   Yes [provider]  sennosides-docusate sodium (SENOKOT-S) 8.6-50 MG tablet Take 1 tablet by mouth 2 (two) times daily.    Yes [provider]  Travoprost, BAK Free, (TRAVATAN Z) 0.004 % SOLN ophthalmic solution Apply 1 drop to eye at bedtime.  08/25/15  Yes [provider]  acetaminophen (TYLENOL) 325 MG tablet Take 650 mg by mouth every 4 (four) hours as needed for pain. pain/fever- do not exceed 3000mg  in 24hr period    [provider]  enoxaparin  (LOVENOX) 40 MG/0.4ML injection Inject 0.4 mLs (40 mg total) into the skin daily for 14 days. X 14 days 03/09/18 03/23/18  Gladstone Lighter, MD  Melatonin 5 MG TABS Take 5 mg by mouth at bedtime as needed for sleep.    [provider]    Allergies Patient has no known allergies.  Family History  Problem Relation Age of Onset  . Pancreatic cancer Mother   . Emphysema Sister   . Glaucoma Sister     Social History Social History   Tobacco Use  . Smoking status: Never Smoker  . Smokeless tobacco: Never Used  Substance Use Topics  . Alcohol use: Not Currently  . Drug use: Not on file    Review of Systems Constitutional: No fever/chills Eyes: No visual changes. ENT: No sore throat. Cardiovascular: Positive for chest pain. Respiratory: Positive for shortness of breath. Gastrointestinal: No abdominal pain.  No nausea, no vomiting.  No diarrhea.  No constipation. Genitourinary: Negative for dysuria. Musculoskeletal: Negative for back pain. Skin: Negative for rash. Neurological: Negative for headaches, focal weakness or numbness.   ____________________________________________   PHYSICAL EXAM:  VITAL SIGNS: ED Triage Vitals  Enc Vitals Group     BP      Pulse      Resp      Temp      Temp src      SpO2      Weight      Height      Head Circumference      Peak Flow      Pain Score      Pain Loc      Pain Edu?      Excl. in [place]?     Constitutional: Pleasant cooperative appears somewhat uncomfortable Eyes: PERRL EOMI. Head: Atraumatic. Nose: No congestion/rhinnorhea. Mouth/Throat: No trismus Neck: No stridor.  Able to lie completely flat although with some JVD Cardiovascular: Normal rate, irregularly irregular rhythm Respiratory: Somewhat increased respiratory effort with crackles at bilateral bases Gastrointestinal: Soft nontender Musculoskeletal: No lower extremity edema   Neurologic:  Normal speech and language. No gross focal neurologic deficits  are appreciated. Skin:  Skin is warm, dry and intact. No rash noted. Psychiatric: Mood and affect are normal. Speech and behavior are normal.    ____________________________________________   DIFFERENTIAL includes but not limited to  Pulmonary embolism, acute coronary syndrome, pneumothorax, pneumonia ____________________________________________   LABS (all labs ordered are listed, but only abnormal results are displayed)  Labs Reviewed  COMPREHENSIVE METABOLIC PANEL - Abnormal; Notable for the following components:      Result Value   Sodium 133 (*)    Chloride 96 (*)    Glucose, Bld 110 (*)    BUN 26 (*)    Calcium 8.5 (*)    Total Protein 6.3 (*)    AST 43 (*)    Alkaline Phosphatase 136 (*)    Total Bilirubin 2.8 (*)  All other components within normal limits  BRAIN NATRIURETIC PEPTIDE - Abnormal; Notable for the following components:   B Natriuretic Peptide 574.0 (*)    All other components within normal limits  CBC WITH DIFFERENTIAL/PLATELET - Abnormal; Notable for the following components:   WBC 22.0 (*)    Neutro Abs 15.4 (*)    Monocytes Absolute 2.9 (*)    Basophils Absolute 0.2 (*)    All other components within normal limits  BASIC METABOLIC PANEL - Abnormal; Notable for the following components:   Chloride 98 (*)    Glucose, Bld 105 (*)    BUN 24 (*)    Calcium 8.2 (*)    All other components within normal limits  CBC - Abnormal; Notable for the following components:   WBC 20.2 (*)    All other components within normal limits  LIPID PANEL - Abnormal; Notable for the following components:   HDL 29 (*)    All other components within normal limits  MRSA PCR SCREENING  URINE CULTURE  TROPONIN I  TROPONIN I  TROPONIN I  URINALYSIS, COMPLETE (UACMP) WITH MICROSCOPIC    Lab work reviewed by me with elevated BNP consistent with stretch.  Elevated white count of unclear etiology but could be secondary to  infection __________________________________________  EKG  ED ECG REPORT I, Darel Hong, the attending physician, personally viewed and interpreted this ECG.  Date: 04/09/2018 EKG Time:  Rate: 73 Rhythm: Atrial fibrillation QRS Axis: normal Intervals: normal ST/T Wave abnormalities: normal Narrative Interpretation: no evidence of acute ischemia  ____________________________________________  RADIOLOGY  CT angiogram of the chest reviewed by me with no PE but is concerning for pulmonary edema ____________________________________________   PROCEDURES  Procedure(s) performed: no  Procedures  Critical Care performed: no  Observation: no ____________________________________________   INITIAL IMPRESSION / ASSESSMENT AND PLAN / ED COURSE  Pertinent labs & imaging results that were available during my care of the patient were reviewed by me and considered in my medical decision making (see chart for details).  The patient arrives with focal right chest tenderness and mild shortness of breath and chest pain.  EKG is nonischemic.  Differential on a 84 year old with chest pain is extremely broad so chest x-ray and labs are pending.  I am adding on a CT angiogram given her recent surgery and pleuritic sounding chest pain.     ----------------------------------------- 5:22 PM on 04/09/2018 ----------------------------------------- I discussed the case with the patient's cardiologist Dr. Ubaldo Glassing who agrees with gentle diuresis and inpatient admission and he will kindly consult on the patient.  It appears to me that the patient has new onset heart failure.  The patient and family verbalized understanding and agreement with the admission. ____________________________________________   FINAL CLINICAL IMPRESSION(S) / ED DIAGNOSES  Final diagnoses:  Chest pain, unspecified type  Acute heart failure, unspecified heart failure type ([place])      NEW MEDICATIONS STARTED DURING THIS  VISIT:  Current Discharge Medication List       Note:  This document was prepared using Dragon voice recognition software and may include unintentional dictation errors.     Darel Hong, MD 04/10/18 1438

## 2018-04-10 ENCOUNTER — Inpatient Hospital Stay: Payer: Medicare Other

## 2018-04-10 ENCOUNTER — Inpatient Hospital Stay
Admit: 2018-04-10 | Discharge: 2018-04-10 | Disposition: A | Discharge status: Home / Self Care | Payer: Medicare Other | Attending: Internal Medicine | Admitting: Internal Medicine

## 2018-04-10 LAB — CBC
HCT: 39.4 % (ref 35.0–47.0)
HEMOGLOBIN: 12.8 g/dL (ref 12.0–16.0)
MCH: 30.6 pg (ref 26.0–34.0)
MCHC: 32.6 g/dL (ref 32.0–36.0)
MCV: 93.9 fL (ref 80.0–100.0)
Platelets: 376 10*3/uL (ref 150–440)
RBC: 4.2 MIL/uL (ref 3.80–5.20)
RDW: 13.6 % (ref 11.5–14.5)
WBC: 20.2 10*3/uL — ABNORMAL HIGH (ref 3.6–11.0)

## 2018-04-10 LAB — BASIC METABOLIC PANEL
Anion gap: 9 (ref 5–15)
BUN: 24 mg/dL — AB (ref 6–20)
CALCIUM: 8.2 mg/dL — AB (ref 8.9–10.3)
CO2: 28 mmol/L (ref 22–32)
Chloride: 98 mmol/L — ABNORMAL LOW (ref 101–111)
Creatinine, Ser: 0.72 mg/dL (ref 0.44–1.00)
GFR calc Af Amer: >60 mL/min (ref >60)
GFR calc non Af Amer: >60 mL/min (ref >60)
Glucose, Bld: 105 mg/dL — ABNORMAL HIGH (ref 65–99)
Potassium: 3.8 mmol/L (ref 3.5–5.1)
Sodium: 135 mmol/L (ref 135–145)

## 2018-04-10 LAB — LIPID PANEL
CHOLESTEROL: 118 mg/dL (ref 0–200)
HDL: 29 mg/dL — ABNORMAL LOW (ref >40)
LDL Cholesterol: 75 mg/dL (ref 0–99)
Total CHOL/HDL Ratio: 4.1 RATIO
Triglycerides: 72 mg/dL (ref <150)
VLDL: 14 mg/dL (ref 0–40)

## 2018-04-10 LAB — ECHOCARDIOGRAM COMPLETE
HEIGHTINCHES: 63 in
WEIGHTICAEL: 1940.05 [oz_av]

## 2018-04-10 MED ORDER — FUROSEMIDE 10 MG/ML IJ SOLN
20.0000 mg | Freq: Every day | INTRAMUSCULAR | Status: DC
  Administered 2018-04-11 – 2018-04-12 (×2): 20 mg via INTRAVENOUS
  Filled 2018-04-10 (×2): qty 2

## 2018-04-10 MED ORDER — AZITHROMYCIN 250 MG PO TABS
250.0000 mg | ORAL_TABLET | Freq: Every day | ORAL | Status: DC
  Administered 2018-04-11 – 2018-04-12 (×2): 250 mg via ORAL
  Filled 2018-04-10 (×2): qty 1

## 2018-04-10 MED ORDER — SODIUM CHLORIDE 0.9 % IV SOLN
1.0000 g | INTRAVENOUS | Status: DC
  Administered 2018-04-10 – 2018-04-11 (×2): 1 g via INTRAVENOUS
  Filled 2018-04-10 (×2): qty 1
  Filled 2018-04-10 (×2): qty 10

## 2018-04-10 MED ORDER — AZITHROMYCIN 250 MG PO TABS
500.0000 mg | ORAL_TABLET | Freq: Every day | ORAL | Status: DC
  Administered 2018-04-10: 500 mg via ORAL
  Filled 2018-04-10: qty 2

## 2018-04-10 NOTE — Progress Notes (Signed)
[place] [facility] [place] NAME: Latia Mataya    MR#:  [id number]  DATE OF BIRTH:  1921-03-03  SUBJECTIVE:   Patient is a poor historian. She has a cough she denies shortness of breath  REVIEW OF SYSTEMS:    Matted review of systems due to cognitive issues and hearing issues   respiratory: Active for cough, shortness of breath, denies wheezing  Cardiovascular: Negative for chest pain, palpitations and leg swelling.  Gastrointestinal: Has abdominal pain  Musculoskeletal: Denies knee pain today     Tolerating Diet: yes      DRUG ALLERGIES:  No Known Allergies  VITALS:  Blood pressure 102/80, pulse 84, temperature 98.4 F (36.9 C), temperature source Oral, resp. rate 18, height 5\' 3"  (1.6 m), weight 55 kg (121 lb 4.1 oz), SpO2 97 %.  PHYSICAL EXAMINATION:  Constitutional: Appears frail does not appear to be in distress HENT: Normocephalic. Marland Kitchen Oropharynx is clear and moist.  Eyes: Conjunctivae and EOM are normal. PERRLA, no scleral icterus.  Neck: Normal ROM. Neck supple. No JVD. No tracheal deviation. CVS: [place], irregular S1/S2 +, no murmurs, no gallops, no carotid bruit.  Pulmonary: Normal respiratory effort with bilateral rhonchi  abdominal: Soft. BS +,  no distension, tenderness, rebound or guarding.  Musculoskeletal: Normal range of motion. No edema and no tenderness.  Neuro: Alert.  Obvious focal deficit  skin: Skin is warm and dry. No rash noted.  Noted Psychiatric: Hard of hearing and cognitively impaired    LABORATORY PANEL:   CBC Recent Labs  Lab 04/10/18 0458  WBC 20.2*  HGB 12.8  HCT 39.4  PLT 376   ------------------------------------------------------------------------------------------------------------------  Chemistries  Recent Labs  Lab 04/09/18 1433 04/10/18 0458  NA 133* 135  K 4.3 3.8  CL 96* 98*  CO2 27 28  GLUCOSE 110* 105*  BUN 26* 24*  CREATININE 0.63 0.72  CALCIUM 8.5* 8.2*  AST  43*  --   ALT 20  --   ALKPHOS 136*  --   BILITOT 2.8*  --    ------------------------------------------------------------------------------------------------------------------  Cardiac Enzymes Recent Labs  Lab 04/09/18 1433 04/09/18 1834 04/09/18 2253  TROPONINI <0.03 <0.03 <0.03   ------------------------------------------------------------------------------------------------------------------  RADIOLOGY:  Dg Knee 1-2 Views Right  Result Date: 04/09/2018 CLINICAL DATA:  84 y/o F; right femur fixation 03/07/2018. No new injury. Persistent right knee swelling. EXAM: RIGHT KNEE - 1-2 VIEW COMPARISON:  03/07/2018 right femur radiographs. FINDINGS: No evidence of fracture, dislocation, or joint effusion. Intramedullary nail partially visualized, no periprosthetic lucency or fracture. Soft tissues are unremarkable. IMPRESSION: No acute fracture or dislocation identified. Electronically Signed   By: Kristine Garbe M.D.   On: 04/09/2018 18:57   Ct Angio Chest Pe W/cm &/or Wo Cm  Result Date: 04/09/2018 CLINICAL DATA:  Right-sided chest pain. EXAM: CT ANGIOGRAPHY CHEST WITH CONTRAST TECHNIQUE: Multidetector CT imaging of the chest was performed using the standard protocol during bolus administration of intravenous contrast. Multiplanar CT image reconstructions and MIPs were obtained to evaluate the vascular anatomy. CONTRAST:  56mL ISOVUE-300 IOPAMIDOL (ISOVUE-300) INJECTION 61% COMPARISON:  One-view chest x-ray 04/09/2018 FINDINGS: Cardiovascular: The heart is enlarged. Bilateral atrial enlargement is present. Atherosclerotic calcifications are present in the aorta without aneurysm. Pulmonary artery opacification is excellent. No focal filling defects are present to suggest pulmonary embolus. Mediastinum/Nodes: No significant mediastinal, hilar, or axillary adenopathy is present. The esophagus is within normal limits. The thoracic inlet is unremarkable. Lungs/Pleura: There is scarring at  the lung  apices bilaterally. Right greater than left pleural effusions are present. Minimal associated airspace disease likely reflects atelectasis. No significant consolidation is present. Upper Abdomen: Splenic calcifications are present. Atherosclerotic disease is noted in the abdominal aorta. Limited imaging of the upper abdomen is otherwise unremarkable. Musculoskeletal: Thoracic kyphosis is present. Vertebral body heights and alignment are maintained. Chronic endplate degenerative changes are present in the lower cervical spine. No focal lytic or blastic lesions are present. The ribs are unremarkable. Sternum is within normal limits. Review of the MIP images confirms the above findings. IMPRESSION: 1. No pulmonary embolus. 2.  Aortic Atherosclerosis (ICD10-I70.0). 3. Cardiomegaly atrial enlargement bilaterally. 4. Right greater than left pleural effusions. 5. Associated airspace disease is most consistent with atelectasis. No significant consolidation is present to suggest infection. Electronically Signed   By: San Morelle M.D.   On: 04/09/2018 16:04   Dg Chest Port 1 View  Result Date: 04/09/2018 CLINICAL DATA:  New onset chest pain. EXAM: PORTABLE CHEST 1 VIEW COMPARISON:  One-view chest x-ray 03/07/2018 FINDINGS: The heart is enlarged. Mild pulmonary vascular congestion is present. New bilateral pleural effusions and basilar airspace disease present the fields are clear. Degenerative changes are noted at the shoulders. IMPRESSION: 1. Cardiomegaly with mild pulmonary vascular congestion. 2. New bilateral pleural effusions and basilar airspace disease. While this may be related to congestive heart failure, infection is not excluded. Electronically Signed   By: San Morelle M.D.   On: 04/09/2018 15:01     ASSESSMENT AND PLAN:   84 year old female with PAF and essential hypertension who presents with shortness of breath.    1.  Sepsis: Patient presents with leukocytosis and  tachypnea I suspect sepsis due to pneumonia not clearly visualized on imaging or/and UTI. Start rocephin and azithromycin CBC slight improvement Follow-up and urine culture  2.  Shortness of breath: This is multifactorial possibly due to new onset CHF as well as pneumonia Follow-up on echo Gentle IV diuresis Follow intake and output with daily weight To biotics for pneumonia Incentive spirometer ordered  3.  PAF: Patient is currently in atrial fibrillation: She is on aspirin for anticoagulation due to fall risk.  Continue metoprolol for heart rate control  4.  COPD without signs exacerbation: Continue inhalers  5.  Essential hypertension: Continue metoprolol  6.  Dementia: Continue Namenda  7.  Glaucoma: Continue eyedrops 8.  Knee pain with recent hip fracture: Orthopedic surgery consultation requested as per family request. X-rays were negative.  Physical therapy evaluation for discharge planning Management plans discussed with the patient and she is in agreement.  CODE STATUS: DNR  TOTAL TIME TAKING CARE OF THIS PATIENT: 30 minutes.    Discussed with Dr. Ubaldo Glassing POSSIBLE D/C 1 to 2 days, DEPENDING ON CLINICAL CONDITION.   Kammie Scioli M.D on 04/10/2018 at 9:12 AM  Between 7am to 6pm - Pager - [phone] After 6pm go to [email or web address] - password [email or web address] [facility]  Office  [phone]  CC: Primary care physician; [place] Pink, MD  Note: This dictation was prepared with Dragon dictation along with smaller phrase technology. Any transcriptional errors that result from this process are unintentional.

## 2018-04-10 NOTE — Consult Note (Signed)
Cardiology Consultation Note    Patient ID: Gina Blankenship, MRN: [id number], DOB/AGE: 05/15/1921 84 y.o. Admit date: 04/09/2018   Date of Consult: 04/10/2018 Primary Physician: Maryland Pink, MD Primary Cardiologist: Dr. Ubaldo Glassing  Chief Complaint: confusion and sob Reason for Consultation: chf Requesting MD: Dr. Benjie Karvonen  HPI: Gina Blankenship is a 84 y.o. female with history of atrial fibrillation, hypertension, palpitations.  Chest x-ray showed cardiomegaly with mild pulmonary vascular congestion and new bilateral pleural effusions.  She complains of a cough.  Chest CT revealed no pulmonary embolus with right greater than left pleural effusions.  There was no consolidation suggestive of infection.  Her cardiac markers were normal.  White count was elevated at 22 with normal renal function.  EKG revealed atrial fibrillation with no ischemic changes.  Rate was in good control.  This a.m. she is confused pleasantly.  She has a cough with some expiratory wheezing.  He denies chest pain.  She has some lower extremity edema bilaterally.  He is scheduled for right upper quadrant abdominal ultrasound and right lower extremity ultrasound.  Bilirubin is mildly elevated.  Past Medical History:  Diagnosis Date  . A-fib ([place])   . Cataract cortical, senile   . Chicken pox   . Colon adenoma    unspecified  . COPD (chronic obstructive pulmonary disease) ([place])   . Diverticulosis   . Glaucoma   . Hemorrhoids   . Hypertension   . Lumbar degenerative disc disease   . Mitral valve prolapse   . Osteoporosis   . Palpitations       Surgical History:  Past Surgical History:  Procedure Laterality Date  . BREAST LUMPECTOMY    . CATARACT EXTRACTION Left   . CHOLECYSTECTOMY    . COLONOSCOPY  08/2000  . CORRECTION HAMMER TOE    . INTRAMEDULLARY (IM) NAIL INTERTROCHANTERIC Right 03/07/2018   Procedure: INTRAMEDULLARY (IM) NAIL INTERTROCHANTRIC;  Surgeon: Lovell Sheehan, MD;  Location: [facility] ORS;  Service:  Orthopedics;  Laterality: Right;     Home Meds: Prior to Admission medications   Medication Sig Start Date End Date Taking? Authorizing Provider  aspirin EC 81 MG tablet Take 81 mg by mouth at bedtime.    Yes [provider]  bisacodyl (BISACODYL) 5 MG EC tablet Take 5 mg by mouth daily as needed for moderate constipation.   Yes [provider]  calcium-vitamin D (OSCAL WITH D) 500-200 MG-UNIT tablet Take 1 tablet by mouth daily with breakfast.   Yes [provider]  Cholecalciferol 1000 units tablet Take 4,000 Units by mouth daily.    Yes [provider]  feeding supplement, ENSURE ENLIVE, (ENSURE ENLIVE) LIQD Take 237 mLs by mouth 2 (two) times daily between meals. 03/09/18  Yes Gladstone Lighter, MD  fluticasone (FLONASE) 50 MCG/ACT nasal spray Place 2 sprays into the nose daily.   Yes [provider]  Fluticasone-Salmeterol (WIXELA INHUB) 100-50 MCG/DOSE AEPB Inhale 1 puff into the lungs 2 (two) times daily.   Yes [provider]  HYDROcodone-acetaminophen (NORCO/VICODIN) 5-325 MG tablet Take 1 tablet by mouth every 6 (six) hours as needed for moderate pain or severe pain. 03/17/18  Yes Toni Arthurs, NP  memantine (NAMENDA) 5 MG tablet Take 5 mg by mouth daily.   Yes [provider]  metoprolol tartrate (LOPRESSOR) 25 MG tablet Take 25 mg by mouth 2 (two) times daily.  02/06/18  Yes [provider]  Multiple Vitamins-Minerals (SENIOR TABS) TABS Take 1 tablet by mouth  daily.   Yes [provider]  sennosides-docusate sodium (SENOKOT-S) 8.6-50 MG tablet Take 1 tablet by mouth 2 (two) times daily.    Yes [provider]  Travoprost, BAK Free, (TRAVATAN Z) 0.004 % SOLN ophthalmic solution Apply 1 drop to eye at bedtime.  08/25/15  Yes [provider]  acetaminophen (TYLENOL) 325 MG tablet Take 650 mg by mouth every 4 (four) hours as needed for pain. pain/fever- do not exceed 3000mg  in 24hr period     [provider]  enoxaparin (LOVENOX) 40 MG/0.4ML injection Inject 0.4 mLs (40 mg total) into the skin daily for 14 days. X 14 days 03/09/18 03/23/18  Gladstone Lighter, MD  Melatonin 5 MG TABS Take 5 mg by mouth at bedtime as needed for sleep.    [provider]    Inpatient Medications:  . aspirin EC  81 mg Oral QHS  . calcium-vitamin D  1 tablet Oral Q breakfast  . cholecalciferol  4,000 Units Oral Daily  . enoxaparin (LOVENOX) injection  40 mg Subcutaneous Q24H  . feeding supplement (ENSURE ENLIVE)  237 mL Oral BID BM  . fluticasone  2 spray Each Nare Daily  . furosemide  20 mg Intravenous BID  . latanoprost  1 drop Both Eyes QHS  . memantine  5 mg Oral Daily  . metoprolol tartrate  25 mg Oral BID  . mometasone-formoterol  2 puff Inhalation BID  . multivitamin with minerals  1 tablet Oral Daily  . potassium chloride  10 mEq Oral Daily  . senna-docusate  1 tablet Oral BID     Allergies: No Known Allergies  Social History   Socioeconomic History  . Marital status: Widowed    Spouse name: Not on file  . Number of children: Not on file  . Years of education: Not on file  . Highest education level: Not on file  Occupational History  . Not on file  Social Needs  . Financial resource strain: Not on file  . Food insecurity:    Worry: Not on file    Inability: Not on file  . Transportation needs:    Medical: Not on file    Non-medical: Not on file  Tobacco Use  . Smoking status: Never Smoker  . Smokeless tobacco: Never Used  Substance and Sexual Activity  . Alcohol use: Not Currently  . Drug use: Not on file  . Sexual activity: Not on file  Lifestyle  . Physical activity:    Days per week: Not on file    Minutes per session: Not on file  . Stress: Not on file  Relationships  . Social connections:    Talks on phone: Not on file    Gets together: Not on file    Attends religious service: Not on file    Active member of club or organization: Not on  file    Attends meetings of clubs or organizations: Not on file    Relationship status: Not on file  . Intimate partner violence:    Fear of current or ex partner: Not on file    Emotionally abused: Not on file    Physically abused: Not on file    Forced sexual activity: Not on file  Other Topics Concern  . Not on file  Social History Narrative  . Not on file     Family History  Problem Relation Age of Onset  . Pancreatic cancer Mother   . Emphysema Sister   . Glaucoma Sister  Review of Systems: A 12-system review of systems was performed and is negative except as noted in the HPI.  Labs: Recent Labs    04/09/18 1433 04/09/18 1834 04/09/18 2253  TROPONINI <0.03 <0.03 <0.03   Lab Results  Component Value Date   WBC 20.2 (H) 04/10/2018   HGB 12.8 04/10/2018   HCT 39.4 04/10/2018   MCV 93.9 04/10/2018   PLT 376 04/10/2018    Recent Labs  Lab 04/09/18 1433 04/10/18 0458  NA 133* 135  K 4.3 3.8  CL 96* 98*  CO2 27 28  BUN 26* 24*  CREATININE 0.63 0.72  CALCIUM 8.5* 8.2*  PROT 6.3*  --   BILITOT 2.8*  --   ALKPHOS 136*  --   ALT 20  --   AST 43*  --   GLUCOSE 110* 105*   Lab Results  Component Value Date   CHOL 118 04/10/2018   HDL 29 (L) 04/10/2018   LDLCALC 75 04/10/2018   TRIG 72 04/10/2018   No results found for: DDIMER  Radiology/Studies:  Dg Knee 1-2 Views Right  Result Date: 04/09/2018 CLINICAL DATA:  84 y/o F; right femur fixation 03/07/2018. No new injury. Persistent right knee swelling. EXAM: RIGHT KNEE - 1-2 VIEW COMPARISON:  03/07/2018 right femur radiographs. FINDINGS: No evidence of fracture, dislocation, or joint effusion. Intramedullary nail partially visualized, no periprosthetic lucency or fracture. Soft tissues are unremarkable. IMPRESSION: No acute fracture or dislocation identified. Electronically Signed   By: Kristine Garbe M.D.   On: 04/09/2018 18:57   Ct Angio Chest Pe W/cm &/or Wo Cm  Result Date:  04/09/2018 CLINICAL DATA:  Right-sided chest pain. EXAM: CT ANGIOGRAPHY CHEST WITH CONTRAST TECHNIQUE: Multidetector CT imaging of the chest was performed using the standard protocol during bolus administration of intravenous contrast. Multiplanar CT image reconstructions and MIPs were obtained to evaluate the vascular anatomy. CONTRAST:  44mL ISOVUE-300 IOPAMIDOL (ISOVUE-300) INJECTION 61% COMPARISON:  One-view chest x-ray 04/09/2018 FINDINGS: Cardiovascular: The heart is enlarged. Bilateral atrial enlargement is present. Atherosclerotic calcifications are present in the aorta without aneurysm. Pulmonary artery opacification is excellent. No focal filling defects are present to suggest pulmonary embolus. Mediastinum/Nodes: No significant mediastinal, hilar, or axillary adenopathy is present. The esophagus is within normal limits. The thoracic inlet is unremarkable. Lungs/Pleura: There is scarring at the lung apices bilaterally. Right greater than left pleural effusions are present. Minimal associated airspace disease likely reflects atelectasis. No significant consolidation is present. Upper Abdomen: Splenic calcifications are present. Atherosclerotic disease is noted in the abdominal aorta. Limited imaging of the upper abdomen is otherwise unremarkable. Musculoskeletal: Thoracic kyphosis is present. Vertebral body heights and alignment are maintained. Chronic endplate degenerative changes are present in the lower cervical spine. No focal lytic or blastic lesions are present. The ribs are unremarkable. Sternum is within normal limits. Review of the MIP images confirms the above findings. IMPRESSION: 1. No pulmonary embolus. 2.  Aortic Atherosclerosis (ICD10-I70.0). 3. Cardiomegaly atrial enlargement bilaterally. 4. Right greater than left pleural effusions. 5. Associated airspace disease is most consistent with atelectasis. No significant consolidation is present to suggest infection. Electronically Signed   By:  San Morelle M.D.   On: 04/09/2018 16:04   Dg Chest Port 1 View  Result Date: 04/09/2018 CLINICAL DATA:  New onset chest pain. EXAM: PORTABLE CHEST 1 VIEW COMPARISON:  One-view chest x-ray 03/07/2018 FINDINGS: The heart is enlarged. Mild pulmonary vascular congestion is present. New bilateral pleural effusions and basilar airspace disease present the fields are  clear. Degenerative changes are noted at the shoulders. IMPRESSION: 1. Cardiomegaly with mild pulmonary vascular congestion. 2. New bilateral pleural effusions and basilar airspace disease. While this may be related to congestive heart failure, infection is not excluded. Electronically Signed   By: San Morelle M.D.   On: 04/09/2018 15:01    Wt Readings from Last 3 Encounters:  04/09/18 55 kg (121 lb 4.1 oz)  03/17/18 55.5 kg (122 lb 4.8 oz)  03/07/18 53.1 kg (117 lb)    EKG: Atrial fibrillation with controlled ventricular response  Physical Exam:  Blood pressure 102/80, pulse 84, temperature 98.4 F (36.9 C), temperature source Oral, resp. rate 18, height 5\' 3"  (1.6 m), weight 55 kg (121 lb 4.1 oz), SpO2 97 %. Body mass index is 21.48 kg/m. General: Well developed, well nourished, in no acute distress. Head: Normocephalic, atraumatic, sclera non-icteric, no xanthomas, nares are without discharge.  Neck: Negative for carotid bruits. JVD not elevated. Lungs: Bilateral rhonchi with expiratory wheezing Heart: Irregularly irregular rhythm, 1/6 to 2/6 systolic murmur into the outflow tract. Abdomen: Soft, non-tender, non-distended with normoactive bowel sounds. No hepatomegaly. No rebound/guarding. No obvious abdominal masses. Msk:  Strength and tone appear normal for age. Extremities: No clubbing or cyanosis.  1-2+ bilateral lower extremity edema right greater than left.  Distal pedal pulses are palpable. Neuro: Confused to place and time.  No facial asymmetry. No focal deficit. Moves all extremities  spontaneously. Psych:  Responds to questions fairly well.     Assessment and Plan  84 year old female with history of chronic atrial fibrillation not currently anticoagulated due to fall and bleeding risk who was admitted after presenting with complaints of shortness of breath and cough.  Chest x-ray revealed bilateral pleural effusions.  Chest CT did not reveal revealed pulmonary embolus but noted bilateral pleural effusions with no obvious airspace disease.  Her white count was elevated at 22,000.  She has ruled out for myocardial infarction.  She denies chest pain.  Would agree with careful diuresis following renal function.  Proceed with empiric antibiotics as discussed.  Will review echo when completed.  Signed, Teodoro Spray MD 04/10/2018, 8:20 AM Pager: [phone]

## 2018-04-10 NOTE — Progress Notes (Signed)
*  PRELIMINARY RESULTS* Echocardiogram 2D Echocardiogram has been performed.  Sherrie Sport 04/10/2018, 9:37 AM

## 2018-04-10 NOTE — Evaluation (Signed)
Physical Therapy Evaluation Patient Details Name: Gina Blankenship MRN: [id number] DOB: 04/24/1921 Today's Date: 04/10/2018   History of Present Illness  Patient sent to ED from Assisted living facility with reported chest pain.     Clinical Impression  Patient received in room with gown removed, covered with sheet only, bed wet. Patient not oriented to person, place or time. Patient able to perform supine to sit with mod assist. Difficulty following direction. Patient performed sit to stand transfer while changing bed linens x 3 reps with mod assist. Only able to stand for brief periods.  Patient will benefit from skilled PT to address her functional limitations and to return to PFL.  Recommend SNF placement upon discharge.     Follow Up Recommendations SNF    Equipment Recommendations       Recommendations for Other Services       Precautions / Restrictions Precautions Precautions: Fall      Mobility  Bed Mobility Overal bed mobility: Needs Assistance Bed Mobility: Supine to Sit;Sit to Supine     Supine to sit: Mod assist Sit to supine: Mod assist   General bed mobility comments: patient requires assistance getting to eob. Not sure if due to difficulty following direction/cognition.  Transfers Overall transfer level: Needs assistance Equipment used: 1 person hand held assist Transfers: Sit to/from Stand Sit to Stand: Mod assist         General transfer comment: patient states that she cannot stand, but with asssit patient able to stand at eob with mod assist for a brief period.    Ambulation/Gait             General Gait Details: patient unable to attempt ambulation this visit.   Stairs            Wheelchair Mobility    Modified Rankin (Stroke Patients Only)       Balance Overall balance assessment: Needs assistance     Sitting balance - Comments: patient requires close supervision while seated on eob.         Standing balance comment:  requires mod assist with standing balance for brief period.                              Pertinent Vitals/Pain Pain Assessment: 0-10 Pain Location: right LE Pain Descriptors / Indicators: Grimacing Pain Intervention(s): Limited activity within patient's tolerance    Home Living Family/patient expects to be discharged to:: Assisted living               Home Equipment: Wheelchair - manual Additional Comments: unsure of equipment used at facility. Patient poor historian, no family present.    Prior Function Level of Independence: Needs assistance   Gait / Transfers Assistance Needed: unknown           Hand Dominance        Extremity/Trunk Assessment        Lower Extremity Assessment Lower Extremity Assessment: Generalized weakness;RLE deficits/detail RLE Deficits / Details: patient appears to have difficulty moving right LE, requires assistance with LEs in the bed. Grabbing at right LE when moving.     Cervical / Trunk Assessment Cervical / Trunk Assessment: Kyphotic  Communication   Communication: HOH  Cognition Arousal/Alertness: Awake/alert Behavior During Therapy: Impulsive Overall Cognitive Status: History of cognitive impairments - at baseline  General Comments      Exercises     Assessment/Plan    PT Assessment Patient needs continued PT services  PT Problem List Decreased strength;Decreased mobility;Decreased safety awareness;Decreased activity tolerance;Decreased cognition;Decreased balance;Pain       PT Treatment Interventions Therapeutic activities;Gait training;Therapeutic exercise;Patient/family education;Functional mobility training    PT Goals (Current goals can be found in the Care Plan section)  Acute Rehab PT Goals Patient Stated Goal: no goals stated PT Goal Formulation: Patient unable to participate in goal setting    Frequency Min 2X/week   Barriers to  discharge        Co-evaluation               AM-PAC PT "6 Clicks" Daily Activity  Outcome Measure Difficulty turning over in bed (including adjusting bedclothes, sheets and blankets)?: Unable Difficulty moving from lying on back to sitting on the side of the bed? : Unable Difficulty sitting down on and standing up from a chair with arms (e.g., wheelchair, bedside commode, etc,.)?: Unable Help needed moving to and from a bed to chair (including a wheelchair)?: A Lot Help needed walking in hospital room?: Total Help needed climbing 3-5 steps with a railing? : Total 6 Click Score: 7    End of Session Equipment Utilized During Treatment: Gait belt Activity Tolerance: Patient limited by fatigue;Patient limited by pain Patient left: in bed;with bed alarm set Nurse Communication: Mobility status PT Visit Diagnosis: Unsteadiness on feet (R26.81);Other abnormalities of gait and mobility (R26.89);Muscle weakness (generalized) (M62.81);History of falling (Z91.81);Difficulty in walking, not elsewhere classified (R26.2)    Time: 1510-1530 PT Time Calculation (min) (ACUTE ONLY): 20 min   Charges:   PT Evaluation $PT Eval Low Complexity: 1 Low     Lysha Schrade, PT, GCS 04/10/18,3:47 PM

## 2018-04-10 NOTE — Consult Note (Signed)
ORTHOPAEDIC CONSULTATION  REQUESTING PHYSICIAN: Bettey Costa, MD  Chief Complaint:   R knee pain  History of Present Illness: History obtained by discussing based on chart review and discussions with other physicians on care team. Patient unable to give full history.   Gina Blankenship is a 84 y.o. female  Admitted to hospital with chest pain and CHF, and she has a history of COPD and a-fib. She had an IM Nail to R femur performed on 03/07/18 by Dr. Harlow Mares. She has been having some R knee pain. Pain is located diffusely about knee. This pain is improved significantly today. Pain is worse with movement. Pain is improved with rest.   Past Medical History:  Diagnosis Date  . A-fib ([place])   . Cataract cortical, senile   . Chicken pox   . Colon adenoma    unspecified  . COPD (chronic obstructive pulmonary disease) ([place])   . Diverticulosis   . Glaucoma   . Hemorrhoids   . Hypertension   . Lumbar degenerative disc disease   . Mitral valve prolapse   . Osteoporosis   . Palpitations    Past Surgical History:  Procedure Laterality Date  . BREAST LUMPECTOMY    . CATARACT EXTRACTION Left   . CHOLECYSTECTOMY    . COLONOSCOPY  08/2000  . CORRECTION HAMMER TOE    . INTRAMEDULLARY (IM) NAIL INTERTROCHANTERIC Right 03/07/2018   Procedure: INTRAMEDULLARY (IM) NAIL INTERTROCHANTRIC;  Surgeon: Lovell Sheehan, MD;  Location: [facility] ORS;  Service: Orthopedics;  Laterality: Right;   Social History   Socioeconomic History  . Marital status: Widowed    Spouse name: Not on file  . Number of children: Not on file  . Years of education: Not on file  . Highest education level: Not on file  Occupational History  . Not on file  Social Needs  . Financial resource strain: Not on file  . Food insecurity:    Worry: Not on file    Inability: Not on file  . Transportation needs:    Medical: Not on file    Non-medical: Not on file  Tobacco  Use  . Smoking status: Never Smoker  . Smokeless tobacco: Never Used  Substance and Sexual Activity  . Alcohol use: Not Currently  . Drug use: Not on file  . Sexual activity: Not on file  Lifestyle  . Physical activity:    Days per week: Not on file    Minutes per session: Not on file  . Stress: Not on file  Relationships  . Social connections:    Talks on phone: Not on file    Gets together: Not on file    Attends religious service: Not on file    Active member of club or organization: Not on file    Attends meetings of clubs or organizations: Not on file    Relationship status: Not on file  Other Topics Concern  . Not on file  Social History Narrative  . Not on file   Family History  Problem Relation Age of Onset  . Pancreatic cancer Mother   . Emphysema Sister   . Glaucoma Sister    No Known Allergies Prior to Admission medications   Medication Sig Start Date End Date Taking? Authorizing Provider  aspirin EC 81 MG tablet Take 81 mg by mouth at bedtime.    Yes [provider]  bisacodyl (BISACODYL) 5 MG EC tablet Take 5 mg by mouth daily as needed for moderate constipation.  Yes [provider]  calcium-vitamin D (OSCAL WITH D) 500-200 MG-UNIT tablet Take 1 tablet by mouth daily with breakfast.   Yes [provider]  Cholecalciferol 1000 units tablet Take 4,000 Units by mouth daily.    Yes [provider]  feeding supplement, ENSURE ENLIVE, (ENSURE ENLIVE) LIQD Take 237 mLs by mouth 2 (two) times daily between meals. 03/09/18  Yes Gladstone Lighter, MD  fluticasone (FLONASE) 50 MCG/ACT nasal spray Place 2 sprays into the nose daily.   Yes [provider]  Fluticasone-Salmeterol (WIXELA INHUB) 100-50 MCG/DOSE AEPB Inhale 1 puff into the lungs 2 (two) times daily.   Yes [provider]  HYDROcodone-acetaminophen (NORCO/VICODIN) 5-325 MG tablet Take 1 tablet by mouth every 6 (six) hours as needed for moderate pain or severe  pain. 03/17/18  Yes Toni Arthurs, NP  memantine (NAMENDA) 5 MG tablet Take 5 mg by mouth daily.   Yes [provider]  metoprolol tartrate (LOPRESSOR) 25 MG tablet Take 25 mg by mouth 2 (two) times daily.  02/06/18  Yes [provider]  Multiple Vitamins-Minerals (SENIOR TABS) TABS Take 1 tablet by mouth daily.   Yes [provider]  sennosides-docusate sodium (SENOKOT-S) 8.6-50 MG tablet Take 1 tablet by mouth 2 (two) times daily.    Yes [provider]  Travoprost, BAK Free, (TRAVATAN Z) 0.004 % SOLN ophthalmic solution Apply 1 drop to eye at bedtime.  08/25/15  Yes [provider]  acetaminophen (TYLENOL) 325 MG tablet Take 650 mg by mouth every 4 (four) hours as needed for pain. pain/fever- do not exceed 3000mg  in 24hr period    [provider]  enoxaparin (LOVENOX) 40 MG/0.4ML injection Inject 0.4 mLs (40 mg total) into the skin daily for 14 days. X 14 days 03/09/18 03/23/18  Gladstone Lighter, MD  Melatonin 5 MG TABS Take 5 mg by mouth at bedtime as needed for sleep.    [provider]   Recent Labs    04/09/18 1433 04/10/18 0458  WBC 22.0* 20.2*  HGB 13.7 12.8  HCT 39.3 39.4  PLT 374 376  K 4.3 3.8  CL 96* 98*  CO2 27 28  BUN 26* 24*  CREATININE 0.63 0.72  GLUCOSE 110* 105*  CALCIUM 8.5* 8.2*   Dg Knee 1-2 Views Right  Result Date: 04/09/2018 CLINICAL DATA:  84 y/o F; right femur fixation 03/07/2018. No new injury. Persistent right knee swelling. EXAM: RIGHT KNEE - 1-2 VIEW COMPARISON:  03/07/2018 right femur radiographs. FINDINGS: No evidence of fracture, dislocation, or joint effusion. Intramedullary nail partially visualized, no periprosthetic lucency or fracture. Soft tissues are unremarkable. IMPRESSION: No acute fracture or dislocation identified. Electronically Signed   By: Kristine Garbe M.D.   On: 04/09/2018 18:57   Ct Angio Chest Pe W/cm &/or Wo Cm  Result Date: 04/09/2018 CLINICAL DATA:  Right-sided  chest pain. EXAM: CT ANGIOGRAPHY CHEST WITH CONTRAST TECHNIQUE: Multidetector CT imaging of the chest was performed using the standard protocol during bolus administration of intravenous contrast. Multiplanar CT image reconstructions and MIPs were obtained to evaluate the vascular anatomy. CONTRAST:  48mL ISOVUE-300 IOPAMIDOL (ISOVUE-300) INJECTION 61% COMPARISON:  One-view chest x-ray 04/09/2018 FINDINGS: Cardiovascular: The heart is enlarged. Bilateral atrial enlargement is present. Atherosclerotic calcifications are present in the aorta without aneurysm. Pulmonary artery opacification is excellent. No focal filling defects are present to suggest pulmonary embolus. Mediastinum/Nodes: No significant mediastinal, hilar, or axillary adenopathy is present. The esophagus is within normal limits. The thoracic inlet is unremarkable.  Lungs/Pleura: There is scarring at the lung apices bilaterally. Right greater than left pleural effusions are present. Minimal associated airspace disease likely reflects atelectasis. No significant consolidation is present. Upper Abdomen: Splenic calcifications are present. Atherosclerotic disease is noted in the abdominal aorta. Limited imaging of the upper abdomen is otherwise unremarkable. Musculoskeletal: Thoracic kyphosis is present. Vertebral body heights and alignment are maintained. Chronic endplate degenerative changes are present in the lower cervical spine. No focal lytic or blastic lesions are present. The ribs are unremarkable. Sternum is within normal limits. Review of the MIP images confirms the above findings. IMPRESSION: 1. No pulmonary embolus. 2.  Aortic Atherosclerosis (ICD10-I70.0). 3. Cardiomegaly atrial enlargement bilaterally. 4. Right greater than left pleural effusions. 5. Associated airspace disease is most consistent with atelectasis. No significant consolidation is present to suggest infection. Electronically Signed   By: San Morelle M.D.   On:  04/09/2018 16:04   US Venous Img Lower Unilateral Right  Result Date: 04/10/2018 CLINICAL DATA:  84 year old female with right lower extremity pain EXAM: RIGHT LOWER EXTREMITY VENOUS DOPPLER ULTRASOUND TECHNIQUE: Gray-scale sonography with graded compression, as well as color Doppler and duplex ultrasound were performed to evaluate the lower extremity deep venous systems from the level of the common femoral vein and including the common femoral, femoral, profunda femoral, popliteal and calf veins including the posterior tibial, peroneal and gastrocnemius veins when visible. The superficial great saphenous vein was also interrogated. Spectral Doppler was utilized to evaluate flow at rest and with distal augmentation maneuvers in the common femoral, femoral and popliteal veins. COMPARISON:  None. FINDINGS: Contralateral Common Femoral Vein: Respiratory phasicity is normal and symmetric with the symptomatic side. No evidence of thrombus. Normal compressibility. Common Femoral Vein: No evidence of thrombus. Normal compressibility, respiratory phasicity and response to augmentation. Saphenofemoral Junction: No evidence of thrombus. Normal compressibility and flow on color Doppler imaging. Profunda Femoral Vein: No evidence of thrombus. Normal compressibility and flow on color Doppler imaging. Femoral Vein: No evidence of thrombus. Normal compressibility, respiratory phasicity and response to augmentation. Popliteal Vein: No evidence of thrombus. Normal compressibility, respiratory phasicity and response to augmentation. Calf Veins: No evidence of thrombus. Normal compressibility and flow on color Doppler imaging. Superficial Great Saphenous Vein: No evidence of thrombus. Normal compressibility. Venous Reflux:  None. Other Findings:  None. IMPRESSION: No evidence of deep venous thrombosis. Electronically Signed   By: Jacqulynn Cadet M.D.   On: 04/10/2018 09:42   Dg Chest Port 1 View  Result Date:  04/09/2018 CLINICAL DATA:  New onset chest pain. EXAM: PORTABLE CHEST 1 VIEW COMPARISON:  One-view chest x-ray 03/07/2018 FINDINGS: The heart is enlarged. Mild pulmonary vascular congestion is present. New bilateral pleural effusions and basilar airspace disease present the fields are clear. Degenerative changes are noted at the shoulders. IMPRESSION: 1. Cardiomegaly with mild pulmonary vascular congestion. 2. New bilateral pleural effusions and basilar airspace disease. While this may be related to congestive heart failure, infection is not excluded. Electronically Signed   By: San Morelle M.D.   On: 04/09/2018 15:01   US Abdomen Limited Ruq  Result Date: 04/10/2018 CLINICAL DATA:  Elevated bilirubin.  Cholecystectomy. EXAM: ULTRASOUND ABDOMEN LIMITED RIGHT UPPER QUADRANT COMPARISON:  None. FINDINGS: Gallbladder: Removed. Common bile duct: Diameter: 2.7 mm, normal. Liver: No focal lesion identified. Within normal limits in parenchymal echogenicity. Portal vein is patent on color Doppler imaging with normal direction of blood flow towards the liver. There are prominent vessels in the hilum of the liver which could represent  periportal varices. IMPRESSION: 1. Liver parenchyma and biliary tree appear normal. 2. Prominent vessels in the hilum of the liver which could represent part periportal varices. However, there is normal portal vein flow. 3. Right pleural effusion. Electronically Signed   By: Lorriane Shire M.D.   On: 04/10/2018 09:30     Positive ROS: All other systems have been reviewed and were otherwise negative with the exception of those mentioned in the HPI and as above.  Physical Exam: BP 102/80 (BP Location: Right Arm)   Pulse 84   Temp 98.4 F (36.9 C) (Oral)   Resp 18   Ht 5\' 3"  (1.6 m)   Wt 55.4 kg (122 lb 2.2 oz)   SpO2 97%   BMI 21.64 kg/m  General:  Alert, no acute distress Psychiatric:  Patient is NOT competent for consent with normal mood and affect, no  irritability Cardiovascular:  No pedal edema, regular rate and rhythm Respiratory:  No wheezing, non-labored breathing GI:  Abdomen is soft and non-tender Skin:  No lesions in the area of chief complaint, no erythema Neurologic:  Sensation intact distally, CN grossly intact Lymphatic:  No axillary or cervical lymphadenopathy  Orthopedic Exam:  RLE: + DF/PF/EHL SILT grossly over foot Foot wwp RoM knee: 0-80, patient starts to guard at ~50 degrees flexion.  There is no erythema or warmth to knee.  There is no effusion.   Prior surgical incision healing well without erythema or drainage.    X-rays:  As above: no notable fractures/dislocations. Mild medial joint space narrowing with subchondral sclerosis, but these are non-weight bearing views so arthritis may be worse than what appears. No joint effusion.   Assessment/Plan: 84 yo F w/L knee pain that is improving and with signs of arthritic changes on radiographs. There is a low suspicion of septic arthritis at this time given range of motion and lack of effusion/warmth/erythema.  1. Recommend NSAIDs/tylenol as needed 2. Can WBAT on RLE if cleared by Dr. Harlow Mares from R IM nail 3. PT/OT 4. F/u as needed with either Dr. Harlow Mares or me for knee. Can follow up with Dr. Harlow Mares regarding hip.     Leim Fabry   04/10/2018 1:45 PM

## 2018-04-11 ENCOUNTER — Encounter
Admission: RE | Admit: 2018-04-11 | Discharge: 2018-04-11 | Disposition: A | Discharge status: Home / Self Care | Payer: Medicare Other | Source: Ambulatory Visit | Attending: Internal Medicine | Admitting: Internal Medicine

## 2018-04-11 ENCOUNTER — Inpatient Hospital Stay: Payer: Medicare Other

## 2018-04-11 LAB — BASIC METABOLIC PANEL
Anion gap: 10 (ref 5–15)
BUN: 20 mg/dL (ref 6–20)
CO2: 25 mmol/L (ref 22–32)
Calcium: 8.2 mg/dL — ABNORMAL LOW (ref 8.9–10.3)
Chloride: 98 mmol/L — ABNORMAL LOW (ref 101–111)
Creatinine, Ser: 0.69 mg/dL (ref 0.44–1.00)
GFR calc non Af Amer: >60 mL/min (ref >60)
Glucose, Bld: 118 mg/dL — ABNORMAL HIGH (ref 65–99)
POTASSIUM: 3.8 mmol/L (ref 3.5–5.1)
SODIUM: 133 mmol/L — AB (ref 135–145)

## 2018-04-11 LAB — CBC
HEMATOCRIT: 36.9 % (ref 35.0–47.0)
HEMOGLOBIN: 12.4 g/dL (ref 12.0–16.0)
MCH: 31.7 pg (ref 26.0–34.0)
MCHC: 33.5 g/dL (ref 32.0–36.0)
MCV: 94.4 fL (ref 80.0–100.0)
Platelets: 377 10*3/uL (ref 150–440)
RBC: 3.91 MIL/uL (ref 3.80–5.20)
RDW: 13.9 % (ref 11.5–14.5)
WBC: 22.6 10*3/uL — ABNORMAL HIGH (ref 3.6–11.0)

## 2018-04-11 NOTE — Progress Notes (Signed)
[place] [facility] [place] NAME: Gina Blankenship    MR#:  [id number]  DATE OF BIRTH:  1920-11-07  SUBJECTIVE:   Family is at bedside.  No acute issues overnight.  REVIEW OF SYSTEMS:    Limited review of systems due to cognitive issues and hearing issues   respiratory: Positive for cough, shortness of breath, denies wheezing  Cardiovascular: Negative for chest pain, palpitations and leg swelling.  Gastrointestinal: Denies abdominal pain  Musculoskeletal: Denies knee pain     Tolerating Diet: yes      DRUG ALLERGIES:  No Known Allergies  VITALS:  Blood pressure 113/62, pulse 72, temperature 98.6 F (37 C), temperature source Oral, resp. rate 14, height 5\' 3"  (1.6 m), weight 54.2 kg (119 lb 6.4 oz), SpO2 97 %.  PHYSICAL EXAMINATION:  Constitutional: Appears frail does not appear to be in distress HENT: Normocephalic.  Oropharynx is clear and moist.  Eyes: Conjunctivae and EOM are normal. no scleral icterus.  Neck: Normal ROM. Neck supple. No JVD. No tracheal deviation. CVS: Irregular, irregular + murmur, no gallops, no carotid bruit.  Pulmonary: Normal respiratory effort with bilateral rhonchi upper chest wall abdominal: Soft. BS +,  no distension, tenderness, rebound or guarding.  Musculoskeletal: . No edema and no tenderness.  Neuro: Alert.  No obvious focal deficit  skin: Skin is warm and dry. No rash noted.  Bruise noted on her leg Psychiatric: Hard of hearing   LABORATORY PANEL:   CBC Recent Labs  Lab 04/11/18 0542  WBC 22.6*  HGB 12.4  HCT 36.9  PLT 377   ------------------------------------------------------------------------------------------------------------------  Chemistries  Recent Labs  Lab 04/09/18 1433  04/11/18 0542  NA 133*   < > 133*  K 4.3   < > 3.8  CL 96*   < > 98*  CO2 27   < > 25  GLUCOSE 110*   < > 118*  BUN 26*   < > 20  CREATININE 0.63   < > 0.69  CALCIUM 8.5*   < > 8.2*  AST 43*  --    --   ALT 20  --   --   ALKPHOS 136*  --   --   BILITOT 2.8*  --   --    < > = values in this interval not displayed.   ------------------------------------------------------------------------------------------------------------------  Cardiac Enzymes Recent Labs  Lab 04/09/18 1433 04/09/18 1834 04/09/18 2253  TROPONINI <0.03 <0.03 <0.03   ------------------------------------------------------------------------------------------------------------------  RADIOLOGY:  Dg Chest 1 View  Result Date: 04/11/2018 CLINICAL DATA:  Chest pain and congestive heart failure. COPD. Recent right femur nailing 03/07/2018. EXAM: CHEST  1 VIEW COMPARISON:  CT chest and chest radiograph 04/09/2018. FINDINGS: Patient is rotated. Trachea is grossly midline. Heart is enlarged. Thoracic aorta is calcified. Lungs are hyperinflated with biapical pleuroparenchymal thickening. No airspace consolidation. Probable residual tiny bilateral pleural effusions, decreased from 04/09/2018. IMPRESSION: 1. Probable trace residual bilateral pleural effusions, improved from 04/09/2018. 2.  Aortic atherosclerosis (ICD10-170.0). Electronically Signed   By: Lorin Picket M.D.   On: 04/11/2018 08:43   Dg Knee 1-2 Views Right  Result Date: 04/09/2018 CLINICAL DATA:  84 y/o F; right femur fixation 03/07/2018. No new injury. Persistent right knee swelling. EXAM: RIGHT KNEE - 1-2 VIEW COMPARISON:  03/07/2018 right femur radiographs. FINDINGS: No evidence of fracture, dislocation, or joint effusion. Intramedullary nail partially visualized, no periprosthetic lucency or fracture. Soft tissues are unremarkable. IMPRESSION: No acute fracture or dislocation identified. Electronically  Signed   By: Kristine Garbe M.D.   On: 04/09/2018 18:57   Ct Angio Chest Pe W/cm &/or Wo Cm  Result Date: 04/09/2018 CLINICAL DATA:  Right-sided chest pain. EXAM: CT ANGIOGRAPHY CHEST WITH CONTRAST TECHNIQUE: Multidetector CT imaging of the chest was  performed using the standard protocol during bolus administration of intravenous contrast. Multiplanar CT image reconstructions and MIPs were obtained to evaluate the vascular anatomy. CONTRAST:  39mL ISOVUE-300 IOPAMIDOL (ISOVUE-300) INJECTION 61% COMPARISON:  One-view chest x-ray 04/09/2018 FINDINGS: Cardiovascular: The heart is enlarged. Bilateral atrial enlargement is present. Atherosclerotic calcifications are present in the aorta without aneurysm. Pulmonary artery opacification is excellent. No focal filling defects are present to suggest pulmonary embolus. Mediastinum/Nodes: No significant mediastinal, hilar, or axillary adenopathy is present. The esophagus is within normal limits. The thoracic inlet is unremarkable. Lungs/Pleura: There is scarring at the lung apices bilaterally. Right greater than left pleural effusions are present. Minimal associated airspace disease likely reflects atelectasis. No significant consolidation is present. Upper Abdomen: Splenic calcifications are present. Atherosclerotic disease is noted in the abdominal aorta. Limited imaging of the upper abdomen is otherwise unremarkable. Musculoskeletal: Thoracic kyphosis is present. Vertebral body heights and alignment are maintained. Chronic endplate degenerative changes are present in the lower cervical spine. No focal lytic or blastic lesions are present. The ribs are unremarkable. Sternum is within normal limits. Review of the MIP images confirms the above findings. IMPRESSION: 1. No pulmonary embolus. 2.  Aortic Atherosclerosis (ICD10-I70.0). 3. Cardiomegaly atrial enlargement bilaterally. 4. Right greater than left pleural effusions. 5. Associated airspace disease is most consistent with atelectasis. No significant consolidation is present to suggest infection. Electronically Signed   By: San Morelle M.D.   On: 04/09/2018 16:04   US Venous Img Lower Unilateral Right  Result Date: 04/10/2018 CLINICAL DATA:  84 year old  female with right lower extremity pain EXAM: RIGHT LOWER EXTREMITY VENOUS DOPPLER ULTRASOUND TECHNIQUE: Gray-scale sonography with graded compression, as well as color Doppler and duplex ultrasound were performed to evaluate the lower extremity deep venous systems from the level of the common femoral vein and including the common femoral, femoral, profunda femoral, popliteal and calf veins including the posterior tibial, peroneal and gastrocnemius veins when visible. The superficial great saphenous vein was also interrogated. Spectral Doppler was utilized to evaluate flow at rest and with distal augmentation maneuvers in the common femoral, femoral and popliteal veins. COMPARISON:  None. FINDINGS: Contralateral Common Femoral Vein: Respiratory phasicity is normal and symmetric with the symptomatic side. No evidence of thrombus. Normal compressibility. Common Femoral Vein: No evidence of thrombus. Normal compressibility, respiratory phasicity and response to augmentation. Saphenofemoral Junction: No evidence of thrombus. Normal compressibility and flow on color Doppler imaging. Profunda Femoral Vein: No evidence of thrombus. Normal compressibility and flow on color Doppler imaging. Femoral Vein: No evidence of thrombus. Normal compressibility, respiratory phasicity and response to augmentation. Popliteal Vein: No evidence of thrombus. Normal compressibility, respiratory phasicity and response to augmentation. Calf Veins: No evidence of thrombus. Normal compressibility and flow on color Doppler imaging. Superficial Great Saphenous Vein: No evidence of thrombus. Normal compressibility. Venous Reflux:  None. Other Findings:  None. IMPRESSION: No evidence of deep venous thrombosis. Electronically Signed   By: Jacqulynn Cadet M.D.   On: 04/10/2018 09:42   Dg Chest Port 1 View  Result Date: 04/09/2018 CLINICAL DATA:  New onset chest pain. EXAM: PORTABLE CHEST 1 VIEW COMPARISON:  One-view chest x-ray 03/07/2018  FINDINGS: The heart is enlarged. Mild pulmonary vascular congestion is present.  New bilateral pleural effusions and basilar airspace disease present the fields are clear. Degenerative changes are noted at the shoulders. IMPRESSION: 1. Cardiomegaly with mild pulmonary vascular congestion. 2. New bilateral pleural effusions and basilar airspace disease. While this may be related to congestive heart failure, infection is not excluded. Electronically Signed   By: San Morelle M.D.   On: 04/09/2018 15:01   US Abdomen Limited Ruq  Result Date: 04/10/2018 CLINICAL DATA:  Elevated bilirubin.  Cholecystectomy. EXAM: ULTRASOUND ABDOMEN LIMITED RIGHT UPPER QUADRANT COMPARISON:  None. FINDINGS: Gallbladder: Removed. Common bile duct: Diameter: 2.7 mm, normal. Liver: No focal lesion identified. Within normal limits in parenchymal echogenicity. Portal vein is patent on color Doppler imaging with normal direction of blood flow towards the liver. There are prominent vessels in the hilum of the liver which could represent periportal varices. IMPRESSION: 1. Liver parenchyma and biliary tree appear normal. 2. Prominent vessels in the hilum of the liver which could represent part periportal varices. However, there is normal portal vein flow. 3. Right pleural effusion. Electronically Signed   By: Lorriane Shire M.D.   On: 04/10/2018 09:30     ASSESSMENT AND PLAN:   84 year old female with PAF and essential hypertension who presents with shortness of breath.    1.  Sepsis: Patient presentedwith leukocytosis and tachypnea Sepsis due to pneumonia and UTI Continue Rocephin and azithromycin CBC for a.m. and follow-up on urine culture   2.  Shortness of breath: This is multifactorial possibly acute on chronic diastolic CHF as well as pneumonia Echo shows preserved ejection fraction with diastolic dysfunction Gentle IV diuresis Follow intake and output with daily weight Continue Rocephin and azithromycin for  pneumonia  3.  PAF: Patient is currently in atrial fibrillation: She is on aspirin for anticoagulation due to fall risk.  Continue metoprolol for heart rate control  4.  COPD without signs exacerbation: Continue inhalers  5.  Essential hypertension: Continue metoprolol  6.  Dementia: Continue Namenda  7.  Glaucoma: Continue eyedrops 8.  Knee pain with recent hip fracture: X-rays were negative for acute fracture.  She was evaluated by orthopedic surgery.  Knee pain is due to severe arthritis.  There is no evidence of septic arthritis.   Physical therapy is recommending skilled nursing facility upon discharge and family is in agreement.     Management plans discussed with the patient family and they are in agreement   CODE STATUS: DNR  TOTAL TIME TAKING CARE OF THIS PATIENT: 24 minutes.    POSSIBLE D/C tomorrow DEPENDING ON CLINICAL CONDITION.   Casy Tavano M.D on 04/11/2018 at 11:10 AM  Between 7am to 6pm - Pager - [phone] After 6pm go to [email or web address] - password [email or web address] [facility]  Office  [phone]  CC: Primary care physician; [place] Pink, MD  Note: This dictation was prepared with Dragon dictation along with smaller phrase technology. Any transcriptional errors that result from this process are unintentional.

## 2018-04-11 NOTE — Clinical Social Work Note (Signed)
Clinical Social Work Assessment  Patient Details  Name: Gina Blankenship MRN: [id number] Date of Birth: [birth year]  Date of referral:  04/11/18               Reason for consult:  Facility Placement                Permission sought to share information with:  Facility Sport and exercise psychologist, Family Supports Permission granted to share information::  Yes, Verbal Permission Granted  Name::     Gina, Blankenship   [phone] or Gina,Blankenship Relative   [phone]   Agency::  SNF admissions  Relationship::     Contact Information:     Housing/Transportation Living arrangements for the past 2 months:  Blankenship, Gina of Information:  Adult Children Patient Interpreter Needed:  None Criminal Activity/Legal Involvement Pertinent to Current Situation/Hospitalization:  No - Comment as needed Significant Relationships:  Adult Children Lives with:  Facility Resident Do you feel safe going back to the place where you live?  No Need for family participation in patient care:  Yes (Comment)  Care giving concerns:  Patient's family feels she needs to go to SNF again to receive some therapy then transition to long term care.   Social Worker assessment / plan:  Patient is a 84 year old female who is alert and orinted x4, but hard of hearing.  CSW spoke to patient's family who were at bedside to complete assessment.  Patient was just at [facility] for some short term rehab, and then discharged to [facility], patient was then readmitted back to the hospital.  Patient's family stated she had just moved into [facility] [place], and they were hoping she could stay at ALF, however patient's family feels she needs more care then originally thought.  CSW explained to patient's family how insurance will cover her stay if she gets approved again for SNF.  CSW explained that she will be in copay days right away, and family expressed understanding.  Patient's family  stated they have applied for Medicaid already and are just waiting for it to be finalized.  Patient's family would prefer either [facility] or [facility] [facility]  CSW was given permission to begin bed search in [place].  Patient and family did not express any other questions or concerns.  Employment status:  Retired Nurse, adult PT Recommendations:  [place] / Referral to community resources:  [place]  Patient/Family's Response to care:  Patient and family are agreeable to having patient go to SNF for short term rehab and to transition to long term care.  Patient/Family's Understanding of and Emotional Response to Diagnosis, Current Treatment, and Prognosis:  Patient's family have decided that patient has become too much care for ALF, and family can not take care of her anymore so they have accepted that patient may be more appropriate for long term care.    Emotional Assessment Appearance:  Appears stated age Attitude/Demeanor/Rapport:    Affect (typically observed):  Appropriate, Happy, Quiet, Stable Orientation:  Oriented to Self, Oriented to Place, Oriented to  Time, Oriented to Situation Alcohol / Substance use:  Not Applicable Psych involvement (Current and /or in the community):  No (Comment)  Discharge Needs  Concerns to be addressed:  Lack of Support Readmission within the last 30 days:  Yes(Mar 09, 2018 to [facility] Current discharge risk:  Lack of support system Barriers to Discharge:  Continued  Medical Work up, [employer]   Gina Blankenship 04/11/2018, 4:50 PM

## 2018-04-11 NOTE — NC FL2 (Signed)
[place] LEVEL OF CARE SCREENING TOOL     IDENTIFICATION  Patient Name: Gina Blankenship Birthdate: [birth year] Sex: female Admission Date (Current Location): 04/09/2018  [place] and [place] Number:  Engineering geologist and Address:  [facility], [address] [place], [place] [address]      Provider Number: [id number]  Attending Physician Name and Address:  Bettey Costa, MD  Relative Name and Phone Number:  Avigail, Pilling   [phone] and Wortley,[name] Relative   [phone]     Current Level of Care: Hospital Recommended Level of Care: [place] Prior Approval Number:    Date Approved/Denied:   PASRR Number: [id number]  Discharge Plan:      Current Diagnoses: Patient Active Problem List   Diagnosis Date Noted  . CHF (congestive heart failure) ([place]) 04/09/2018  . Status post hip surgery 03/22/2018  . Pressure injury of skin 03/08/2018  . Closed right hip fracture ([place]) 03/07/2018    Orientation RESPIRATION BLADDER Height & Weight     Self, Time, Situation, Place  Normal Incontinent Weight: 119 lb 6.4 oz (54.2 kg) Height:  5\' 3"  (160 cm)  BEHAVIORAL SYMPTOMS/MOOD NEUROLOGICAL BOWEL NUTRITION STATUS      Continent Diet(Low sodium diet)  AMBULATORY STATUS COMMUNICATION OF NEEDS Skin   Limited Assist Verbally Normal                       Personal Care Assistance Level of Assistance  Bathing, Feeding, Dressing Bathing Assistance: Limited assistance Feeding assistance: Independent Dressing Assistance: Limited assistance     Functional Limitations Info  Hearing, Speech, Sight Sight Info: Adequate Hearing Info: Impaired Speech Info: Adequate    SPECIAL CARE FACTORS FREQUENCY  PT (By licensed PT)     PT Frequency: 5x a week              Contractures Contractures Info: Not present    Additional Factors Info  Code Status, Allergies Code Status Info: DNR Allergies Info: NKA           Current Medications (04/11/2018):  This is the current hospital active medication list Current Facility-Administered Medications  Medication Dose Route Frequency Provider Last Rate Last Dose  . acetaminophen (TYLENOL) tablet 650 mg  650 mg Oral Q4H PRN Loletha Grayer, MD      . aspirin EC tablet 81 mg  81 mg Oral QHS Loletha Grayer, MD   81 mg at 04/10/18 2127  . azithromycin (ZITHROMAX) tablet 250 mg  250 mg Oral Daily Bettey Costa, MD   250 mg at 04/11/18 0837  . bisacodyl (DULCOLAX) EC tablet 5 mg  5 mg Oral Daily PRN Loletha Grayer, MD      . calcium-vitamin D (OSCAL WITH D) 500-200 MG-UNIT per tablet 1 tablet  1 tablet Oral Q breakfast Loletha Grayer, MD   1 tablet at 04/11/18 0837  . cefTRIAXone (ROCEPHIN) 1 g in sodium chloride 0.9 % 100 mL IVPB  1 g Intravenous Q24H Bettey Costa, MD   Stopped at 04/11/18 0932  . cholecalciferol (VITAMIN D) tablet 4,000 Units  4,000 Units Oral Daily Loletha Grayer, MD   4,000 Units at 04/11/18 [phone]  . enoxaparin (LOVENOX) injection 40 mg  40 mg Subcutaneous Q24H Loletha Grayer, MD   40 mg at 04/10/18 2127  . feeding supplement (ENSURE ENLIVE) (ENSURE ENLIVE) liquid 237 mL  237 mL Oral BID BM Loletha Grayer, MD   237 mL at 04/11/18 8546  .  fluticasone (FLONASE) 50 MCG/ACT nasal spray 2 spray  2 spray Each Nare Daily Loletha Grayer, MD   2 spray at 04/10/18 1027  . furosemide (LASIX) injection 20 mg  20 mg Intravenous Daily Bettey Costa, MD   20 mg at 04/11/18 0837  . HYDROcodone-acetaminophen (NORCO/VICODIN) 5-325 MG per tablet 1 tablet  1 tablet Oral Q6H PRN Wieting, Richard, MD      . latanoprost (XALATAN) 0.005 % ophthalmic solution 1 drop  1 drop Both Eyes QHS Loletha Grayer, MD   1 drop at 04/10/18 2128  . Melatonin TABS 5 mg  5 mg Oral QHS PRN Loletha Grayer, MD   5 mg at 04/10/18 2127  . memantine (NAMENDA) tablet 5 mg  5 mg Oral Daily Loletha Grayer, MD   5 mg at 04/11/18 0837  . metoprolol tartrate (LOPRESSOR) tablet 25 mg   25 mg Oral BID Loletha Grayer, MD   25 mg at 04/11/18 0836  . mometasone-formoterol (DULERA) 100-5 MCG/ACT inhaler 2 puff  2 puff Inhalation BID Loletha Grayer, MD   2 puff at 04/10/18 2127  . multivitamin with minerals tablet 1 tablet  1 tablet Oral Daily Loletha Grayer, MD   1 tablet at 04/11/18 0837  . ondansetron (ZOFRAN) tablet 4 mg  4 mg Oral Q6H PRN Loletha Grayer, MD       Or  . ondansetron [facility]) injection 4 mg  4 mg Intravenous Q6H PRN Wieting, Richard, MD      . potassium chloride SA (K-DUR,KLOR-CON) CR tablet 10 mEq  10 mEq Oral Daily Loletha Grayer, MD   10 mEq at 04/11/18 1443  . senna-docusate (Senokot-S) tablet 1 tablet  1 tablet Oral BID Loletha Grayer, MD   1 tablet at 04/10/18 2127     Discharge Medications: Please see discharge summary for a list of discharge medications.  Relevant Imaging Results:  Relevant Lab Results:   Additional Information SSN [id number]  Ross Ludwig, [place]

## 2018-04-11 NOTE — Plan of Care (Signed)
?  Problem: Activity: ?Goal: Capacity to carry out activities will improve ?Outcome: Progressing ?  ?

## 2018-04-11 NOTE — Progress Notes (Signed)
Physical Therapy Treatment Patient Details Name: Gina Blankenship MRN: [id number] DOB: 1921/02/26 Today's Date: 04/11/2018    History of Present Illness Pt is a 84 y/o F who presented with chest pain and R knee pain.  Was found to be in CHF in the ED and found to have pneumonia.  Sepsis due to pneumonia and UTI.  Pt with recent R hip IM nail 03/07/18 and d/c to [facility] for rehab.  Pt's PMH includes a-fib, COPD, lumbar DDD, osteoporosis, dementia.    PT Comments    Ms. Harlow Mares was agreeable to therapy and put forth good effort with therapeutic exercises.  She requires mod assist for bed mobility and assist to complete exercises in supine and sitting EOB.  Deferred OOB activity as pt demonstrates fatigue but also unsure of WB status RLE at this time.  Attempted to call Dr. Harlow Mares to confirm WBAT (pt was WBAT following surgery on 03/07/18) but with no answer.  Dr. Serita Grit note on 6/10 suggests confirming WBAT with Dr. Harlow Mares.  SNF remains most appropriate d/c plan at this time.   Follow Up Recommendations  SNF     Equipment Recommendations  Other (comment)(TBD at next venue of care)    Recommendations for Other Services       Precautions / Restrictions Precautions Precautions: Fall Restrictions Weight Bearing Restrictions: Yes RLE Weight Bearing: (Unsure, was WBAT at time of surgery 03/07/18) Other Position/Activity Restrictions: Pt was WBAT at time of surgery 03/07/18 but Dr. Serita Grit note on 6/10 suggests confirming WBAT with Dr. Harlow Mares.  This PT attempted to call Dr. Harlow Mares with no answer.  Therefore no OOB activity this session.     Mobility  Bed Mobility Overal bed mobility: Needs Assistance Bed Mobility: Supine to Sit;Sit to Supine     Supine to sit: Mod assist;HOB elevated Sit to supine: Mod assist   General bed mobility comments: Assist required to advance RLE to EOB and to elevate trunk for supine>sit.  Pt requires assist to bring BLEs back into bed.    Transfers                  General transfer comment: Deferred as no confirmation on WB status from Dr. Harlow Mares  Ambulation/Gait                 Stairs             Wheelchair Mobility    Modified Rankin (Stroke Patients Only)       Balance Overall balance assessment: Needs assistance Sitting-balance support: Single extremity supported;Feet supported Sitting balance-Leahy Scale: Poor Sitting balance - Comments: Pt relies on at least 1UE support when sitting EOB                                     Cognition Arousal/Alertness: Awake/alert Behavior During Therapy: Anxious;Restless Overall Cognitive Status: History of cognitive impairments - at baseline                                        Exercises General Exercises - Lower Extremity Long Arc Quad: AAROM;Right;10 reps;Seated Heel Slides: AAROM;Right;10 reps;Supine Hip ABduction/ADduction: AAROM;Right;10 reps;Supine Straight Leg Raises: AAROM;Right;10 reps;Supine Hip Flexion/Marching: AAROM;Right;10 reps;Seated    General Comments        Pertinent Vitals/Pain Pain Assessment: Faces Faces Pain Scale: Hurts even more  Pain Location: R hip with mobility Pain Descriptors / Indicators: Grimacing;Guarding Pain Intervention(s): Limited activity within patient's tolerance;Monitored during session    Home Living                      Prior Function            PT Goals (current goals can now be found in the care plan section) Acute Rehab PT Goals Patient Stated Goal: none stated PT Goal Formulation: Patient unable to participate in goal setting Progress towards PT goals: Not progressing toward goals - comment(due to unsure of WBing status and pt fatigue)    Frequency    Min 2X/week      PT Plan Current plan remains appropriate    Co-evaluation              AM-PAC PT "6 Clicks" Daily Activity  Outcome Measure  Difficulty turning over in bed (including adjusting  bedclothes, sheets and blankets)?: Unable Difficulty moving from lying on back to sitting on the side of the bed? : Unable Difficulty sitting down on and standing up from a chair with arms (e.g., wheelchair, bedside commode, etc,.)?: Unable Help needed moving to and from a bed to chair (including a wheelchair)?: A Lot Help needed walking in hospital room?: Total Help needed climbing 3-5 steps with a railing? : Total 6 Click Score: 7    End of Session   Activity Tolerance: Patient limited by pain;Patient limited by fatigue Patient left: in bed;with call bell/phone within reach;with bed alarm set Nurse Communication: Mobility status;Other (comment)(pt needs to be cleaned up) PT Visit Diagnosis: Unsteadiness on feet (R26.81);Other abnormalities of gait and mobility (R26.89);Muscle weakness (generalized) (M62.81);History of falling (Z91.81);Difficulty in walking, not elsewhere classified (R26.2)     Time: 1093-2355 PT Time Calculation (min) (ACUTE ONLY): 12 min  Charges:  $Therapeutic Exercise: 8-22 mins                    G Codes:       Collie Siad PT, DPT 04/11/2018, 3:42 PM

## 2018-04-11 NOTE — Clinical Social Work Note (Signed)
CSW met with patient's family and discussed bed offers, patient's family would like to have patient go to [facility].  CSW spoke to [facility], and they can accept patient and will start insurance authorization.  CSW was informed by patient's family that they are looking to transition to long term care from rehab, and they have also applied for Medicaid.  CSW to continue to follow patient's progress throughout discharge planning.  Jones Broom. [place], MSW, [place]  04/11/2018 4:25 PM

## 2018-04-12 LAB — BASIC METABOLIC PANEL
Anion gap: 7 (ref 5–15)
BUN: 20 mg/dL (ref 6–20)
CALCIUM: 8.3 mg/dL — AB (ref 8.9–10.3)
CHLORIDE: 102 mmol/L (ref 101–111)
CO2: 27 mmol/L (ref 22–32)
CREATININE: 0.6 mg/dL (ref 0.44–1.00)
GFR calc Af Amer: >60 mL/min (ref >60)
GFR calc non Af Amer: >60 mL/min (ref >60)
Glucose, Bld: 116 mg/dL — ABNORMAL HIGH (ref 65–99)
Potassium: 3.5 mmol/L (ref 3.5–5.1)
SODIUM: 136 mmol/L (ref 135–145)

## 2018-04-12 LAB — CBC
HCT: 39.7 % (ref 35.0–47.0)
HEMOGLOBIN: 13.1 g/dL (ref 12.0–16.0)
MCH: 31.3 pg (ref 26.0–34.0)
MCHC: 32.9 g/dL (ref 32.0–36.0)
MCV: 94.9 fL (ref 80.0–100.0)
Platelets: 352 10*3/uL (ref 150–440)
RBC: 4.18 MIL/uL (ref 3.80–5.20)
RDW: 14 % (ref 11.5–14.5)
WBC: 21.1 10*3/uL — ABNORMAL HIGH (ref 3.6–11.0)

## 2018-04-12 MED ORDER — AZITHROMYCIN 250 MG PO TABS: ORAL_TABLET | ORAL | 0 refills | Status: DC

## 2018-04-12 MED ORDER — HYDROCODONE-ACETAMINOPHEN 5-325 MG PO TABS: 1.0000 | ORAL_TABLET | Freq: Four times a day (QID) | ORAL | 0 refills | Status: DC | PRN

## 2018-04-12 MED ORDER — ENSURE ENLIVE PO LIQD: 237.0000 mL | Freq: Two times a day (BID) | ORAL | 12 refills | Status: DC

## 2018-04-12 MED ORDER — CEFUROXIME AXETIL 500 MG PO TABS: 500.0000 mg | ORAL_TABLET | Freq: Two times a day (BID) | ORAL | 0 refills | Status: DC

## 2018-04-12 MED ORDER — FUROSEMIDE 20 MG PO TABS: 20.0000 mg | ORAL_TABLET | Freq: Every day | ORAL | 11 refills | Status: AC

## 2018-04-12 NOTE — Progress Notes (Signed)
Pt to be discharged to [facility] place today to romm 210 b. Iv and tele removed. Report called to tabitha at  the facility. Transport by e.m.s.

## 2018-04-12 NOTE — Care Management Important Message (Signed)
Copy of signed IM left with patient and family in room. 

## 2018-04-12 NOTE — Clinical Social Work Note (Addendum)
Patient to be d/c'ed today to [facility] room 210B.  Patient and family agreeable to plans will transport via ems RN to call report to [phone].  CSW updated patient's daughter in law [name], [phone] who will update patient's son.  Evette Cristal, MSW, [place]

## 2018-04-12 NOTE — Plan of Care (Signed)
  Problem: Skin Integrity: Goal: Risk for impaired skin integrity will decrease Outcome: Progressing   

## 2018-04-12 NOTE — Evaluation (Signed)
Clinical/Bedside Swallow Evaluation Patient Details  Name: Gina Blankenship MRN: [id number] Date of Birth: 05/03/1921  Today's Date: 04/12/2018 Time: SLP Start Time (ACUTE ONLY): 0830 SLP Stop Time (ACUTE ONLY): 0930 SLP Time Calculation (min) (ACUTE ONLY): 60 min  Past Medical History:  Past Medical History:  Diagnosis Date  . A-fib ([place])   . Cataract cortical, senile   . Chicken pox   . Colon adenoma    unspecified  . COPD (chronic obstructive pulmonary disease) ([place])   . Diverticulosis   . Glaucoma   . Hemorrhoids   . Hypertension   . Lumbar degenerative disc disease   . Mitral valve prolapse   . Osteoporosis   . Palpitations    Past Surgical History:  Past Surgical History:  Procedure Laterality Date  . BREAST LUMPECTOMY    . CATARACT EXTRACTION Left   . CHOLECYSTECTOMY    . COLONOSCOPY  08/2000  . CORRECTION HAMMER TOE    . INTRAMEDULLARY (IM) NAIL INTERTROCHANTERIC Right 03/07/2018   Procedure: INTRAMEDULLARY (IM) NAIL INTERTROCHANTRIC;  Surgeon: Lovell Sheehan, MD;  Location: [facility] ORS;  Service: Orthopedics;  Laterality: Right;   HPI:  Pt is a 84 y.o. female with a known history of COPD, atrial fibrillation and she presents with chest pain.  The patient is a poor historian and it hard of hearing and difficult to get a history from her.  Family at the bedside stated that she complained of chest pain in the right side of her chest.  The patient currently denies chest pain.  The patient recently had a hip fracture surgery and was over at [facility] and now at the [place] assisted living.  She has been having pain in her right knee and not been able to mobilize very well.  She has had a poor appetite.  In the ER, she was found to be in congestive heart failure. This could impact overall stamina and energy/desire for oral intake - especially tougher foods/meats to eat. Pt denied any swallowing problems at her baseline. CXR revealed trace residual bilateral pleural effusions, improved  in setting of CHF. Pt is also more sedentary in light of recent hip fx.    Assessment / Plan / Recommendation Clinical Impression  Pt appears to present w/ adequate oropharyngeal phase swallow function w/ reduced risk for aspiration when following general aspiration precautions. Pt was positioned upright and presented w/ trials of thin liquids via Cup and Straw(pt fed self) and purees/solids w/ no overt s/s of aspiration noted; clear vocal quality b/t trials and no respiratory decline post trials. Oral phase was grossly [facility] w/ liquids and softened foods - pt stated the softened foods were "easier". Pt denied any new issues w/ her speech or language; pt is HOH. OM exam appeared grossly WFL. Recommend a more Mech Soft type diet consistency for soft, moist foods w/ general aspiration precautions. Pills in PUREE if easier, safer for swallowing. No further skilled ST services indicated at this time. NSG updated and will reconsult ST services if needed while admitted.  SLP Visit Diagnosis: Dysphagia, unspecified (R13.10)    Aspiration Risk  (reduced following precautions)    Diet Recommendation  Mech Soft type diet consistency as pt stated softer foods were "easier for her"; Thin liquids. General aspiration precautions; Dietitian f/u for ENSUREs as per pt request.   Medication Administration: Whole meds with puree(if easier, safer for swallowing)    Other  Recommendations Recommended Consults: (Dietician f/u - pt requests ENSURES) Oral Care Recommendations: Oral care  BID;Staff/trained caregiver to provide oral care;Patient independent with oral care Other Recommendations: (n/a)   Follow up Recommendations None      Frequency and Duration (n/a)  (n/a)       Prognosis Prognosis for Safe Diet Advancement: Good Barriers to Reach Goals: (deconditioned)      Swallow Study   General Date of Onset: 04/09/18 HPI: Pt is a 84 y.o. female with a known history of COPD, atrial fibrillation and she  presents with chest pain.  The patient is a poor historian and it hard of hearing and difficult to get a history from her.  Family at the bedside stated that she complained of chest pain in the right side of her chest.  The patient currently denies chest pain.  The patient recently had a hip fracture surgery and was over at [facility] and now at the [place] assisted living.  She has been having pain in her right knee and not been able to mobilize very well.  She has had a poor appetite.  In the ER, she was found to be in congestive heart failure. This could impact overall stamina and energy/desire for oral intake - especially tougher foods/meats to eat. Pt denied any swallowing problems at her baseline. CXR revealed trace residual bilateral pleural effusions, improved in setting of CHF. Pt is also more sedentary in light of recent hip fx.  Type of Study: Bedside Swallow Evaluation Previous Swallow Assessment: none reported Diet Prior to this Study: Regular;Thin liquids Temperature Spikes Noted: No(wbc 21.1 ) Respiratory Status: Room air History of Recent Intubation: No Behavior/Cognition: Alert;Cooperative;Pleasant mood(HOH) Oral Cavity Assessment: Dry(min) Oral Care Completed by SLP: Yes Oral Cavity - Dentition: Adequate natural dentition(few missing) Vision: Functional for self-feeding Self-Feeding Abilities: Able to feed self;Needs assist;Needs set up Patient Positioning: Upright in bed Baseline Vocal Quality: Normal(adequate) Volitional Cough: Strong(adequate) Volitional Swallow: Able to elicit    Oral/Motor/Sensory Function Overall Oral Motor/Sensory Function: Within functional limits   Ice Chips Ice chips: Within functional limits Presentation: Spoon(fed; 2 trials)   Thin Liquid Thin Liquid: Within functional limits Presentation: Cup;Self Fed;Straw(~4-6 ozs b/t coffee, juice, water)    Nectar Thick Nectar Thick Liquid: Not tested   Honey Thick Honey Thick Liquid: Not tested   Puree Puree:  Within functional limits Presentation: Self Fed;Spoon(6 trials)   Solid   GO   Solid: Impaired(preferred "softer" foods easy to chew) Presentation: Self Fed;Spoon(8 trials of pancakes) Oral Phase Impairments: (adequate) Oral Phase Functional Implications: (adequate) Pharyngeal Phase Impairments: (none)        Gina Kenner, MS, CCC-SLP Gina Blankenship 04/12/2018,12:45 PM

## 2018-04-12 NOTE — Discharge Summary (Signed)
[facility] NAME: Gina Blankenship    MR#:  [id number]  DATE OF BIRTH:  Aug 18, 1921  DATE OF ADMISSION:  04/09/2018 ADMITTING PHYSICIAN: Loletha Grayer, MD  DATE OF DISCHARGE: 04/12/2018  PRIMARY CARE PHYSICIAN: Maryland Pink, MD    ADMISSION DIAGNOSIS:  Knee pain [M25.569] Pain [R52] Elevated bilirubin [R17] Chest pain [R07.9] Acute heart failure, unspecified heart failure type ([place]) [I50.9] Chest pain, unspecified type [R07.9]  DISCHARGE DIAGNOSIS:  Active Problems:   CHF (congestive heart failure) ([place])   SECONDARY DIAGNOSIS:   Past Medical History:  Diagnosis Date  . A-fib ([place])   . Cataract cortical, senile   . Chicken pox   . Colon adenoma    unspecified  . COPD (chronic obstructive pulmonary disease) ([place])   . Diverticulosis   . Glaucoma   . Hemorrhoids   . Hypertension   . Lumbar degenerative disc disease   . Mitral valve prolapse   . Osteoporosis   . Palpitations     HOSPITAL COURSE:   84 year old female with PAF and essential hypertension who presents with shortness of breath.    1.  Sepsis: Patient presentedwith leukocytosis and tachypnea Sepsis is due to pneumonia and UTI She was placed on  Rocephin and azithromycin She will be discharged on oral Ceftin and azithromycin.  Her CBC has been consistently elevated at 22.  We asked the PCP please follow-up on CBC in 3 days.  She had no fevers during this hospital stay.  2.  Shortness of breath: This is multifactorial possibly acute on chronic diastolic CHF as well as pneumonia Echo shows preserved ejection fraction with diastolic dysfunction She will follow-up with CHF clinic upon discharge.  She was discharged with Lasix 20 mg daily. She has diuresed and is euvolemic at this time.  Repeat chest x-ray shows much improvement in pulmonary edema.    3.  PAF: Patient is currently in atrial fibrillation: She is on aspirin for anticoagulation due to fall  risk.  Continue metoprolol for heart rate control  4.  COPD without signs exacerbation: Continue inhalers  5.  Essential hypertension: Continue metoprolol  6.  Dementia: Continue Namenda  7.  Glaucoma: Continue eyedrops 8.  Knee pain with recent hip fracture: X-rays were negative for acute fracture.  She was evaluated by orthopedic surgery.  Knee pain is due to severe arthritis.  There is no evidence of septic arthritis.     DISCHARGE CONDITIONS AND DIET:   Stable for discharge Diet Recommended at Discharge:  Regular w/ MEATS CUT w/ Gravy added to moisten; Thin liquids. General Aspiration precautions, Pills in Puree if needed for easier, safer swallowing. Prefers ENSURES. Tray Setup at meals.   CONSULTS OBTAINED:  Treatment Team:  Teodoro Spray, MD Leim Fabry, MD  DRUG ALLERGIES:  No Known Allergies  DISCHARGE MEDICATIONS:   Allergies as of 04/12/2018   No Known Allergies     Medication List    STOP taking these medications   enoxaparin 40 MG/0.4ML injection Commonly known as:  LOVENOX     TAKE these medications   acetaminophen 325 MG tablet Commonly known as:  TYLENOL Take 650 mg by mouth every 4 (four) hours as needed for pain. pain/fever- do not exceed 3000mg  in 24hr period   aspirin EC 81 MG tablet Take 81 mg by mouth at bedtime.   azithromycin 250 MG tablet Commonly known as:  ZITHROMAX 1 tablet daily for 2 days.   bisacodyl 5 MG  EC tablet Generic drug:  bisacodyl Take 5 mg by mouth daily as needed for moderate constipation.   calcium-vitamin D 500-200 MG-UNIT tablet Commonly known as:  OSCAL WITH D Take 1 tablet by mouth daily with breakfast.   cefUROXime 500 MG tablet Commonly known as:  CEFTIN Take 1 tablet (500 mg total) by mouth 2 (two) times daily with a meal.   Cholecalciferol 1000 units tablet Take 4,000 Units by mouth daily.   feeding supplement (ENSURE ENLIVE) Liqd Take 237 mLs by mouth 2 (two) times daily between meals. What  changed:  Another medication with the same name was added. Make sure you understand how and when to take each.   feeding supplement (ENSURE ENLIVE) Liqd Take 237 mLs by mouth 2 (two) times daily between meals. What changed:  You were already taking a medication with the same name, and this prescription was added. Make sure you understand how and when to take each.   fluticasone 50 MCG/ACT nasal spray Commonly known as:  FLONASE Place 2 sprays into the nose daily.   furosemide 20 MG tablet Commonly known as:  LASIX Take 1 tablet (20 mg total) by mouth daily.   HYDROcodone-acetaminophen 5-325 MG tablet Commonly known as:  NORCO/VICODIN Take 1 tablet by mouth every 6 (six) hours as needed for moderate pain or severe pain.   Melatonin 5 MG Tabs Take 5 mg by mouth at bedtime as needed for sleep.   memantine 5 MG tablet Commonly known as:  NAMENDA Take 5 mg by mouth daily.   metoprolol tartrate 25 MG tablet Commonly known as:  LOPRESSOR Take 25 mg by mouth 2 (two) times daily.   SENIOR TABS Tabs Take 1 tablet by mouth daily.   sennosides-docusate sodium 8.6-50 MG tablet Commonly known as:  SENOKOT-S Take 1 tablet by mouth 2 (two) times daily.   TRAVATAN Z 0.004 % Soln ophthalmic solution Generic drug:  Travoprost (BAK Free) Apply 1 drop to eye at bedtime.   WIXELA INHUB 100-50 MCG/DOSE Aepb Generic drug:  Fluticasone-Salmeterol Inhale 1 puff into the lungs 2 (two) times daily.         Today   CHIEF COMPLAINT:   No acute issues   VITAL SIGNS:  Blood pressure 129/82, pulse 74, temperature (!) 97.5 F (36.4 C), temperature source Oral, resp. rate 18, height 5\' 3"  (1.6 m), weight 54.3 kg (119 lb 12.8 oz), SpO2 99 %.   REVIEW OF SYSTEMS:  Review of Systems  Constitutional: Positive for malaise/fatigue. Negative for chills and fever.       Hard of hearing  HENT: Negative.  Negative for ear discharge, ear pain, hearing loss, nosebleeds and sore throat.   Eyes:  Negative.  Negative for blurred vision and pain.  Respiratory: Negative.  Negative for cough, hemoptysis, shortness of breath and wheezing.   Cardiovascular: Negative.  Negative for chest pain, palpitations and leg swelling.  Gastrointestinal: Negative.  Negative for abdominal pain, blood in stool, diarrhea, nausea and vomiting.  Genitourinary: Negative.  Negative for dysuria.  Musculoskeletal: Negative.  Negative for back pain.  Skin: Negative.   Neurological: Negative for dizziness, tremors, speech change, focal weakness, seizures and headaches.  Endo/Heme/Allergies: Negative.  Does not bruise/bleed easily.  Psychiatric/Behavioral: Negative.  Negative for depression, hallucinations and suicidal ideas.     PHYSICAL EXAMINATION:  GENERAL:  84 y.o.-year-old patient lying in the bed with no acute distress.  NECK:  Supple, no jugular venous distention. No thyroid enlargement, no tenderness.  LUNGS: Normal breath  sounds bilaterally, no wheezing, rales,rhonchi  No use of accessory muscles of respiration.  CARDIOVASCULAR: S1, S2 normal. 3/6 SEM NO, rubs, or gallops.  ABDOMEN: Soft, non-tender, non-distended. Bowel sounds present. No organomegaly or mass.  EXTREMITIES: No pedal edema, cyanosis, or clubbing.  PSYCHIATRIC: The patient is alert and oriented x 3.  SKIN: No obvious rash, lesion, or ulcer.   DATA REVIEW:   CBC Recent Labs  Lab 04/12/18 0543  WBC 21.1*  HGB 13.1  HCT 39.7  PLT 352    Chemistries  Recent Labs  Lab 04/09/18 1433  04/12/18 0543  NA 133*   < > 136  K 4.3   < > 3.5  CL 96*   < > 102  CO2 27   < > 27  GLUCOSE 110*   < > 116*  BUN 26*   < > 20  CREATININE 0.63   < > 0.60  CALCIUM 8.5*   < > 8.3*  AST 43*  --   --   ALT 20  --   --   ALKPHOS 136*  --   --   BILITOT 2.8*  --   --    < > = values in this interval not displayed.    Cardiac Enzymes Recent Labs  Lab 04/09/18 1433 04/09/18 1834 04/09/18 2253  TROPONINI <0.03 <0.03 <0.03     Microbiology Results  @[email or web address] @  RADIOLOGY:  Dg Chest 1 View  Result Date: 04/11/2018 CLINICAL DATA:  Chest pain and congestive heart failure. COPD. Recent right femur nailing 03/07/2018. EXAM: CHEST  1 VIEW COMPARISON:  CT chest and chest radiograph 04/09/2018. FINDINGS: Patient is rotated. Trachea is grossly midline. Heart is enlarged. Thoracic aorta is calcified. Lungs are hyperinflated with biapical pleuroparenchymal thickening. No airspace consolidation. Probable residual tiny bilateral pleural effusions, decreased from 04/09/2018. IMPRESSION: 1. Probable trace residual bilateral pleural effusions, improved from 04/09/2018. 2.  Aortic atherosclerosis (ICD10-170.0). Electronically Signed   By: Lorin Picket M.D.   On: 04/11/2018 08:43      Allergies as of 04/12/2018   No Known Allergies     Medication List    STOP taking these medications   enoxaparin 40 MG/0.4ML injection Commonly known as:  LOVENOX     TAKE these medications   acetaminophen 325 MG tablet Commonly known as:  TYLENOL Take 650 mg by mouth every 4 (four) hours as needed for pain. pain/fever- do not exceed 3000mg  in 24hr period   aspirin EC 81 MG tablet Take 81 mg by mouth at bedtime.   azithromycin 250 MG tablet Commonly known as:  ZITHROMAX 1 tablet daily for 2 days.   bisacodyl 5 MG EC tablet Generic drug:  bisacodyl Take 5 mg by mouth daily as needed for moderate constipation.   calcium-vitamin D 500-200 MG-UNIT tablet Commonly known as:  OSCAL WITH D Take 1 tablet by mouth daily with breakfast.   cefUROXime 500 MG tablet Commonly known as:  CEFTIN Take 1 tablet (500 mg total) by mouth 2 (two) times daily with a meal.   Cholecalciferol 1000 units tablet Take 4,000 Units by mouth daily.   feeding supplement (ENSURE ENLIVE) Liqd Take 237 mLs by mouth 2 (two) times daily between meals. What changed:  Another medication with the same name was added. Make sure you understand how and when  to take each.   feeding supplement (ENSURE ENLIVE) Liqd Take 237 mLs by mouth 2 (two) times daily between meals. What changed:  You were already taking a medication  with the same name, and this prescription was added. Make sure you understand how and when to take each.   fluticasone 50 MCG/ACT nasal spray Commonly known as:  FLONASE Place 2 sprays into the nose daily.   furosemide 20 MG tablet Commonly known as:  LASIX Take 1 tablet (20 mg total) by mouth daily.   HYDROcodone-acetaminophen 5-325 MG tablet Commonly known as:  NORCO/VICODIN Take 1 tablet by mouth every 6 (six) hours as needed for moderate pain or severe pain.   Melatonin 5 MG Tabs Take 5 mg by mouth at bedtime as needed for sleep.   memantine 5 MG tablet Commonly known as:  NAMENDA Take 5 mg by mouth daily.   metoprolol tartrate 25 MG tablet Commonly known as:  LOPRESSOR Take 25 mg by mouth 2 (two) times daily.   SENIOR TABS Tabs Take 1 tablet by mouth daily.   sennosides-docusate sodium 8.6-50 MG tablet Commonly known as:  SENOKOT-S Take 1 tablet by mouth 2 (two) times daily.   TRAVATAN Z 0.004 % Soln ophthalmic solution Generic drug:  Travoprost (BAK Free) Apply 1 drop to eye at bedtime.   WIXELA INHUB 100-50 MCG/DOSE Aepb Generic drug:  Fluticasone-Salmeterol Inhale 1 puff into the lungs 2 (two) times daily.          Stable for discharge snf  Patient should follow up with pcp  CODE STATUS:     Code Status Orders  (From admission, onward)        Start     Ordered   04/09/18 1753  Do not attempt resuscitation (DNR)  Continuous    Question Answer Comment  In the event of cardiac or respiratory ARREST Do not call a "code blue"   In the event of cardiac or respiratory ARREST Do not perform Intubation, CPR, defibrillation or ACLS   In the event of cardiac or respiratory ARREST Use medication by any route, position, wound care, and other measures to relive pain and suffering. May use  oxygen, suction and manual treatment of airway obstruction as needed for comfort.   Comments nurse may pronounce      04/09/18 1752    Code Status History    Date Active Date Inactive Code Status Order ID Comments User Context   03/07/2018 0926 03/09/2018 1819 DNR [id number]  Demetrios Loll, MD Inpatient    Advance Directive Documentation     Most Recent Value  Type of Advance Directive  Out of facility DNR (pink MOST or yellow form)  Pre-existing out of facility DNR order (yellow form or pink MOST form)  Yellow form placed in chart (order not valid for inpatient use)  "MOST" Form in Place?  -      TOTAL TIME TAKING CARE OF THIS PATIENT: 38 minutes.    Note: This dictation was prepared with Dragon dictation along with smaller phrase technology. Any transcriptional errors that result from this process are unintentional.  Deziah Renwick M.D on 04/12/2018 at 10:16 AM  Between 7am to 6pm - Pager - [phone] After 6pm go to [email or web address] - password [email or web address] [facility]  Office  [phone]  CC: Primary care physician; [place] Pink, MD

## 2018-04-13 ENCOUNTER — Other Ambulatory Visit: Payer: Self-pay

## 2018-04-13 MED ORDER — HYDROCODONE-ACETAMINOPHEN 5-325 MG PO TABS: 1.0000 | ORAL_TABLET | Freq: Four times a day (QID) | ORAL | 0 refills | Status: DC | PRN

## 2018-04-13 NOTE — Telephone Encounter (Signed)
Rx sent to [facility] phone : [phone] , fax : [phone]  

## 2018-04-14 ENCOUNTER — Other Ambulatory Visit
Admission: RE | Admit: 2018-04-14 | Discharge: 2018-04-14 | Disposition: A | Discharge status: Home / Self Care | Payer: Medicare Other | Source: Ambulatory Visit | Attending: Gerontology | Admitting: Gerontology

## 2018-04-14 DIAGNOSIS — J189 Pneumonia, unspecified organism: Secondary | ICD-10-CM | POA: Insufficient documentation

## 2018-04-14 LAB — BASIC METABOLIC PANEL
Anion gap: 8 (ref 5–15)
BUN: 23 mg/dL — ABNORMAL HIGH (ref 6–20)
CHLORIDE: 99 mmol/L — AB (ref 101–111)
CO2: 27 mmol/L (ref 22–32)
Calcium: 7.8 mg/dL — ABNORMAL LOW (ref 8.9–10.3)
Creatinine, Ser: 0.56 mg/dL (ref 0.44–1.00)
Glucose, Bld: 103 mg/dL — ABNORMAL HIGH (ref 65–99)
POTASSIUM: 3.2 mmol/L — AB (ref 3.5–5.1)
SODIUM: 134 mmol/L — AB (ref 135–145)

## 2018-04-14 LAB — CBC WITH DIFFERENTIAL/PLATELET
Band Neutrophils: 0 %
Basophils Absolute: 0.2 10*3/uL — ABNORMAL HIGH (ref 0–0.1)
Basophils Relative: 1 %
Blasts: 0 %
Eosinophils Absolute: 0 10*3/uL (ref 0–0.7)
Eosinophils Relative: 0 %
HCT: 37.7 % (ref 35.0–47.0)
Hemoglobin: 12.4 g/dL (ref 12.0–16.0)
Lymphocytes Relative: 11 %
Lymphs Abs: 2 10*3/uL (ref 1.0–3.6)
MCH: 30.7 pg (ref 26.0–34.0)
MCHC: 32.9 g/dL (ref 32.0–36.0)
MCV: 93.4 fL (ref 80.0–100.0)
Metamyelocytes Relative: 0 %
Monocytes Absolute: 3.3 10*3/uL — ABNORMAL HIGH (ref 0.2–0.9)
Monocytes Relative: 18 %
Myelocytes: 0 %
Neutro Abs: 12.6 10*3/uL — ABNORMAL HIGH (ref 1.4–6.5)
Neutrophils Relative %: 70 %
Other: 0 %
Platelets: 335 10*3/uL (ref 150–440)
Promyelocytes Relative: 0 %
RBC: 4.04 MIL/uL (ref 3.80–5.20)
RDW: 13.8 % (ref 11.5–14.5)
WBC: 18.1 10*3/uL — ABNORMAL HIGH (ref 3.6–11.0)
nRBC: 0 /100 WBC

## 2018-04-25 ENCOUNTER — Encounter: Payer: Self-pay | Admitting: Gerontology

## 2018-04-26 ENCOUNTER — Encounter: Payer: Self-pay | Admitting: Gerontology

## 2018-04-26 NOTE — Progress Notes (Signed)
Opened in error; Disregard.

## 2018-05-01 ENCOUNTER — Encounter
Admission: RE | Admit: 2018-05-01 | Discharge: 2018-05-01 | Disposition: A | Discharge status: Home / Self Care | Payer: Medicare Other | Source: Ambulatory Visit | Attending: Internal Medicine | Admitting: Internal Medicine

## 2018-05-05 ENCOUNTER — Ambulatory Visit: Payer: Medicare Other | Admitting: Family

## 2018-05-11 ENCOUNTER — Non-Acute Institutional Stay (SKILLED_NURSING_FACILITY): Payer: Medicare Other | Admitting: Adult Health

## 2018-05-11 ENCOUNTER — Encounter: Payer: Self-pay | Admitting: Adult Health

## 2018-05-11 DIAGNOSIS — I10 Essential (primary) hypertension: Secondary | ICD-10-CM | POA: Diagnosis not present

## 2018-05-11 DIAGNOSIS — F0390 Unspecified dementia without behavioral disturbance: Secondary | ICD-10-CM | POA: Diagnosis not present

## 2018-05-11 DIAGNOSIS — I509 Heart failure, unspecified: Secondary | ICD-10-CM

## 2018-05-11 DIAGNOSIS — I480 Paroxysmal atrial fibrillation: Secondary | ICD-10-CM

## 2018-05-11 DIAGNOSIS — J449 Chronic obstructive pulmonary disease, unspecified: Secondary | ICD-10-CM

## 2018-05-11 NOTE — Progress Notes (Signed)
Location:  [facility] Room Number: 310-A Place of Service:  SNF (31) Provider:  Durenda Age, NP  Patient Care Team: [place] Pink, MD as PCP - General (Family Medicine)  Extended Emergency Contact Information Primary Emergency Contact: Makina, Skow Address: [address]          [place], [place] [address] Johnnette Litter of [employer] Phone: [phone] Relation: Son Secondary Emergency Contact: Berggren,[name] Address: [address]          [place], [place] [address] Johnnette Litter of [employer] Phone: [phone] Relation: Relative  Code Status:  DNR  Goals of care: Advanced Directive information Advanced Directives 05/11/2018  Does Patient Have a Medical Advance Directive? Yes  Type of Advance Directive Out of facility DNR (pink MOST or yellow form)  Does patient want to make changes to medical advance directive? No - Patient declined  Copy of [place] in Chart? -  Pre-existing out of facility DNR order (yellow form or pink MOST form) -     Chief Complaint  Patient presents with  . Medical Management of Chronic Issues    The patient is seen for a routine [facility] visit    HPI:  Pt is a 84 y.o. female seen today for medical management of chronic diseases.  She is a long-term care resident of [facility].  She has a PMH of COPD, dementia without behavioral disturbance, hypertensive heart disease with heart failure, atrial fibrillation, glaucoma, and age-related osteoporosis.     Past Medical History:  Diagnosis Date  . A-fib ([place])   . Cataract cortical, senile   . Chicken pox   . Colon adenoma    unspecified  . COPD (chronic obstructive pulmonary disease) ([place])   . Diverticulosis   . Glaucoma   . Hemorrhoids   . Hypertension   . Lumbar degenerative disc disease   . Mitral valve prolapse   . Osteoporosis   . Palpitations    Past Surgical History:  Procedure  Laterality Date  . BREAST LUMPECTOMY    . CATARACT EXTRACTION Left   . CHOLECYSTECTOMY    . COLONOSCOPY  08/2000  . CORRECTION HAMMER TOE    . INTRAMEDULLARY (IM) NAIL INTERTROCHANTERIC Right 03/07/2018   Procedure: INTRAMEDULLARY (IM) NAIL INTERTROCHANTRIC;  Surgeon: Lovell Sheehan, MD;  Location: [facility] ORS;  Service: Orthopedics;  Laterality: Right;    No Known Allergies  Outpatient Encounter Medications as of 05/11/2018  Medication Sig  . acetaminophen (TYLENOL) 325 MG tablet Take 650 mg by mouth every 4 (four) hours as needed for pain. pain/fever- do not exceed 3000mg  in 24hr period  . aspirin EC 81 MG tablet Take 81 mg by mouth at bedtime.   . bisacodyl (BISACODYL) 5 MG EC tablet Take 5 mg by mouth daily as needed for moderate constipation.  . calcium-vitamin D (OSCAL WITH D) 500-200 MG-UNIT tablet Take 1 tablet by mouth daily with breakfast.  . Cholecalciferol 1000 units tablet Take 4,000 Units by mouth daily.   . feeding supplement, ENSURE ENLIVE, (ENSURE ENLIVE) LIQD Take 237 mLs by mouth 2 (two) times daily between meals.  . fluticasone (FLONASE) 50 MCG/ACT nasal spray Place 2 sprays into the nose daily.   . Fluticasone-Salmeterol (WIXELA INHUB) 100-50 MCG/DOSE AEPB Inhale 1 puff into the lungs 2 (two) times daily.  . furosemide (LASIX) 20 MG tablet Take 1 tablet (20 mg total) by mouth daily.  Marland Kitchen HYDROcodone-acetaminophen (NORCO/VICODIN) 5-325 MG tablet Take 1  tablet by mouth every 6 (six) hours as needed for moderate pain or severe pain.  . Melatonin 5 MG TABS Take 5 mg by mouth at bedtime as needed for sleep.  . memantine (NAMENDA) 5 MG tablet Take 5 mg by mouth daily.   . metoprolol tartrate (LOPRESSOR) 25 MG tablet Take 25 mg by mouth 2 (two) times daily.   . Multiple Vitamins-Minerals (SENIOR TABS) TABS Take 1 tablet by mouth daily.   . sennosides-docusate sodium (SENOKOT-S) 8.6-50 MG tablet Take 1 tablet by mouth daily.   . Skin Protectants, Misc. (ENDIT EX) Apply topically.  apply thick layer to denuded area left buttock TID and prn.  . Travoprost, BAK Free, (TRAVATAN Z) 0.004 % SOLN ophthalmic solution Apply 1 drop to eye at bedtime.    No facility-administered encounter medications on file as of 05/11/2018.     Review of Systems  GENERAL: No change in appetite, no fatigue, no weight changes, no fever, chills or weakness EARS:  Hard of hearing MOUTH and THROAT: Denies oral discomfort, gingival pain or bleeding RESPIRATORY: no cough, SOB, DOE, wheezing, hemoptysis CARDIAC: No chest pain, or palpitations GI: No abdominal pain, diarrhea, constipation, heart burn, nausea or vomiting GU: Denies dysuria, frequency, hematuria, or discharge PSYCHIATRIC: Denies feelings of depression or anxiety. No report of hallucinations, insomnia, paranoia, or agitation   Immunization History  Administered Date(s) Administered  . Influenza Inj Mdck Quad Pf 07/12/2016  . Influenza Split 08/23/2014  . Pneumococcal Polysaccharide-23 08/23/2014   Pertinent  Health Maintenance Due  Topic Date Due  . PNA vac Low Risk Adult (2 of 2 - PCV13) 08/24/2015  . INFLUENZA VACCINE  06/01/2018  . DEXA SCAN  Discontinued      Vitals:   05/11/18 0840  BP: 126/78  Pulse: 78  Resp: 16  Temp: 98.6 F (37 C)  TempSrc: Oral  SpO2: 97%  Weight: 118 lb 6.4 oz (53.7 kg)  Height: 5\' 3"  (1.6 m)   Body mass index is 20.97 kg/m.  Physical Exam  GENERAL APPEARANCE:  In no acute distress. Normal body habitus SKIN:  Skin is warm and dry.  MOUTH and THROAT: Lips are without lesions. Oral mucosa is moist and without lesions.  RESPIRATORY: Breathing is even & unlabored, BS CTAB CARDIAC: Irregularly irregular, no murmur,no extra heart sounds, BLE 1+ edema GI: Abdomen soft, normal BS, no masses, no tenderness EXTREMITIES:  Able to move X 4 extremities PSYCHIATRIC: Alert to self, disoriented to time and place. Affect and behavior are appropriate  Labs reviewed: Recent Labs     04/11/18 0542 04/12/18 0543 04/14/18 0605  NA 133* 136 134*  K 3.8 3.5 3.2*  CL 98* 102 99*  CO2 25 27 27   GLUCOSE 118* 116* 103*  BUN 20 20 23*  CREATININE 0.69 0.60 0.56  CALCIUM 8.2* 8.3* 7.8*   Recent Labs    03/09/18 0749 03/23/18 0400 04/09/18 1433  AST 28 23 43*  ALT 19 21 20   ALKPHOS 71 116 136*  BILITOT 1.7* 2.0* 2.8*  PROT 5.4* 6.1* 6.3*  ALBUMIN 3.0* 3.4* 3.5   Recent Labs    03/23/18 0400 04/09/18 1433  04/11/18 0542 04/12/18 0543 04/14/18 0605  WBC 12.0* 22.0*   < > 22.6* 21.1* 18.1*  NEUTROABS 8.1* 15.4*  --   --   --  12.6*  HGB 12.5 13.7   < > 12.4 13.1 12.4  HCT 38.0 39.3   < > 36.9 39.7 37.7  MCV 96.9 93.5   < >  94.4 94.9 93.4  PLT 273 374   < > 377 352 335   < > = values in this interval not displayed.   Lab Results  Component Value Date   TSH 4.82 (H) 09/26/2014   No results found for: HGBA1C Lab Results  Component Value Date   CHOL 118 04/10/2018   HDL 29 (L) 04/10/2018   LDLCALC 75 04/10/2018   TRIG 72 04/10/2018   CHOLHDL 4.1 04/10/2018    Assessment/Plan  1. Essential hypertension - well-controlled, continue Metoprolol tartrate 25 mg 1 tab BID   2. Congestive heart failure, unspecified HF chronicity, unspecified heart failure type (HCC) - no SOB, continue  Metoprolol tartrate 25 mg 1 tab BIDand Lasix 20 mg 1 tab daily   3. PAF (paroxysmal atrial fibrillation) (H[facility]) - rate-controlled, continue  Metoprolol tartrate 25 mg 1 tab BID   4. Dementia without behavioral disturbance, unspecified dementia type - continue Memantine 5 mg daily, supportive care and fall precautions   5. COPD, unspecified -  No wheezing, continue Wixella Inhub 1 puff BID      Family/ staff Communication: Discussed plan of care with resident.  Labs/tests ordered:  None  Goals of care:   Short-term care   Durenda Age, NP [facility] [phone] (Monday-Friday 8:00 a.m. - 5:00 p.m.) [phone] (after  hours)

## 2018-05-16 ENCOUNTER — Other Ambulatory Visit: Payer: Self-pay

## 2018-05-16 MED ORDER — HYDROCODONE-ACETAMINOPHEN 5-325 MG PO TABS: 1.0000 | ORAL_TABLET | Freq: Four times a day (QID) | ORAL | 0 refills | Status: DC | PRN

## 2018-05-16 NOTE — Telephone Encounter (Signed)
Rx sent to [facility] phone : [phone] , fax : [phone]  

## 2018-06-01 ENCOUNTER — Encounter
Admission: RE | Admit: 2018-06-01 | Discharge: 2018-06-01 | Disposition: A | Discharge status: Home / Self Care | Payer: Medicare Other | Source: Ambulatory Visit | Attending: Internal Medicine | Admitting: Internal Medicine

## 2018-06-01 DIAGNOSIS — J449 Chronic obstructive pulmonary disease, unspecified: Secondary | ICD-10-CM | POA: Insufficient documentation

## 2018-06-19 ENCOUNTER — Encounter: Payer: Self-pay | Admitting: Internal Medicine

## 2018-06-19 NOTE — Progress Notes (Signed)
Opened in error; Disregard.

## 2018-06-20 ENCOUNTER — Encounter: Payer: Self-pay | Admitting: Adult Health

## 2018-06-20 ENCOUNTER — Non-Acute Institutional Stay (SKILLED_NURSING_FACILITY): Payer: Medicare Other | Admitting: Adult Health

## 2018-06-20 DIAGNOSIS — F0390 Unspecified dementia without behavioral disturbance: Secondary | ICD-10-CM

## 2018-06-20 DIAGNOSIS — J449 Chronic obstructive pulmonary disease, unspecified: Secondary | ICD-10-CM

## 2018-06-20 DIAGNOSIS — E559 Vitamin D deficiency, unspecified: Secondary | ICD-10-CM

## 2018-06-20 DIAGNOSIS — I10 Essential (primary) hypertension: Secondary | ICD-10-CM

## 2018-06-20 DIAGNOSIS — J309 Allergic rhinitis, unspecified: Secondary | ICD-10-CM

## 2018-06-20 DIAGNOSIS — I509 Heart failure, unspecified: Secondary | ICD-10-CM

## 2018-06-20 NOTE — Progress Notes (Signed)
Location:  [facility] Number: [place] of Service:  SNF (31) Provider:  Durenda Age, NP  Patient Care Team: [place] Pink, MD as PCP - General (Family Medicine)  Extended Emergency Contact Information Primary Emergency Contact: Rosslyn, Pasion Address: [address]          [place], [place] [address] Johnnette Litter of [place] Phone: [phone] Mobile Phone: [phone] Relation: Son Secondary Emergency Contact: Coburn,[name] Address: [address]          [place], [place] [address] Johnnette Litter of [place] Phone: [phone] Mobile Phone: [phone] Relation: Relative  Code Status:  DNR  Goals of care: Advanced Directive information Advanced Directives 06/20/2018  Does Patient Have a Medical Advance Directive? Yes  Type of Paramedic of [place];Out of facility DNR (pink MOST or yellow form)  Does patient want to make changes to medical advance directive? No - Patient declined  Copy of [place] in Chart? Yes  Pre-existing out of facility DNR order (yellow form or pink MOST form) Yellow form placed in chart (order not valid for inpatient use)     Chief Complaint  Patient presents with  . Medical Management of Chronic Issues    Routine Visit    HPI:  Pt is a 84 y.o. female seen today for medical management of chronic diseases.  She is a long-term care resident of [facility].  She has a PMH of COPD, dementia without behavioral disturbances, hypertensive heart disease with heart failure, atrial fibrillation, and glaucoma. She was seen in the room today.      Past Medical History:  Diagnosis Date  . A-fib ([place])   . Cataract cortical, senile   . Chicken pox   . Colon adenoma    unspecified  . COPD (chronic obstructive pulmonary disease) ([place])   . Diverticulosis   . Glaucoma   . Hemorrhoids   . Hypertension   . Lumbar degenerative disc disease   . Mitral valve  prolapse   . Osteoporosis   . Palpitations    Past Surgical History:  Procedure Laterality Date  . BREAST LUMPECTOMY    . CATARACT EXTRACTION Left   . CHOLECYSTECTOMY    . COLONOSCOPY  08/2000  . CORRECTION HAMMER TOE    . INTRAMEDULLARY (IM) NAIL INTERTROCHANTERIC Right 03/07/2018   Procedure: INTRAMEDULLARY (IM) NAIL INTERTROCHANTRIC;  Surgeon: Lovell Sheehan, MD;  Location: [facility] ORS;  Service: Orthopedics;  Laterality: Right;    No Known Allergies  Outpatient Encounter Medications as of 06/20/2018  Medication Sig  . acetaminophen (TYLENOL) 325 MG tablet Take 650 mg by mouth every 4 (four) hours as needed for pain. pain/fever- do not exceed 3000mg  in 24hr period  . aspirin EC 81 MG tablet Take 81 mg by mouth at bedtime.   . bisacodyl (BISACODYL) 5 MG EC tablet Take 5 mg by mouth daily as needed for moderate constipation.  . calcium-vitamin D (OSCAL WITH D) 500-200 MG-UNIT tablet Take 1 tablet by mouth daily with breakfast.  . Cholecalciferol 1000 units tablet Take 4,000 Units by mouth daily.   . feeding supplement, ENSURE ENLIVE, (ENSURE ENLIVE) LIQD Take 237 mLs by mouth 2 (two) times daily between meals.  . fluticasone (FLONASE) 50 MCG/ACT nasal spray Place 2 sprays into the nose daily.   . Fluticasone-Salmeterol (WIXELA INHUB) 100-50 MCG/DOSE AEPB Inhale 1 puff into the lungs 2 (two) times daily.  . furosemide (LASIX) 20 MG tablet Take 1 tablet (20 mg total)  by mouth daily.  Marland Kitchen HYDROcodone-acetaminophen (NORCO/VICODIN) 5-325 MG tablet Take 1 tablet by mouth every 6 (six) hours as needed for moderate pain or severe pain.  . Melatonin 5 MG TABS Take 5 mg by mouth at bedtime as needed for sleep.  . memantine (NAMENDA) 5 MG tablet Take 5 mg by mouth daily.   . metoprolol tartrate (LOPRESSOR) 25 MG tablet Take 25 mg by mouth 2 (two) times daily.   . Multiple Vitamins-Minerals (SENIOR TABS) TABS Take 1 tablet by mouth daily.   . NON FORMULARY Diet Type: Regular with chopped meat and  added gravy  . sennosides-docusate sodium (SENOKOT-S) 8.6-50 MG tablet Take 1 tablet by mouth daily.   . Skin Protectants, Misc. (ENDIT EX) Apply topically. apply thick layer to denuded area left buttock TID and prn.  . Travoprost, BAK Free, (TRAVATAN Z) 0.004 % SOLN ophthalmic solution Apply 1 drop to eye at bedtime.    No facility-administered encounter medications on file as of 06/20/2018.     Review of Systems  GENERAL: No change in appetite, no fatigue, no weight changes, no fever, chills or weakness MOUTH and THROAT: Denies oral discomfort, gingival pain or bleeding, pain from teeth or hoarseness   RESPIRATORY: no cough, SOB, DOE, wheezing, hemoptysis CARDIAC: No chest pain, or palpitations GI: No abdominal pain, diarrhea, constipation, heart burn, nausea or vomiting GU: Denies dysuria, frequency, hematuria, or discharge PSYCHIATRIC: Denies feelings of depression or anxiety. No report of hallucinations, insomnia, paranoia, or agitation   Immunization History  Administered Date(s) Administered  . Influenza Inj Mdck Quad Pf 07/12/2016  . Influenza Split 08/23/2014  . Pneumococcal Polysaccharide-23 08/23/2014   Pertinent  Health Maintenance Due  Topic Date Due  . INFLUENZA VACCINE  08/01/2018 (Originally 06/01/2018)  . PNA vac Low Risk Adult (2 of 2 - PCV13) 06/19/2024 (Originally 08/24/2015)  . DEXA SCAN  Discontinued     Vitals:   06/20/18 1320  BP: 120/64  Pulse: 72  Resp: (!) 24  Temp: 98 F (36.7 C)  TempSrc: Oral  SpO2: 96%  Weight: 118 lb 6.4 oz (53.7 kg)  Height: 5\' 3"  (1.6 m)   Body mass index is 20.97 kg/m.  Physical Exam  GENERAL APPEARANCE: Well nourished. In no acute distress. Normal body habitus SKIN:  Skin is warm and dry.  MOUTH and THROAT: Lips are without lesions. Oral mucosa is moist and without lesions. Tongue is normal in shape, size, and color and without lesions RESPIRATORY: Breathing is even & unlabored, BS CTAB CARDIAC: RRR, no murmur,no  extra heart sounds, RLE 1+  Edema, LLE 2+ edema GI: Abdomen soft, normal BS, no masses, no tenderness EXTREMITIES:  Able to move X 4 extremities PSYCHIATRIC: Alert to self and place, disoriented to time.  Affect and behavior are appropriate  Labs reviewed: Recent Labs    04/11/18 0542 04/12/18 0543 04/14/18 0605  NA 133* 136 134*  K 3.8 3.5 3.2*  CL 98* 102 99*  CO2 25 27 27   GLUCOSE 118* 116* 103*  BUN 20 20 23*  CREATININE 0.69 0.60 0.56  CALCIUM 8.2* 8.3* 7.8*   Recent Labs    03/09/18 0749 03/23/18 0400 04/09/18 1433  AST 28 23 43*  ALT 19 21 20   ALKPHOS 71 116 136*  BILITOT 1.7* 2.0* 2.8*  PROT 5.4* 6.1* 6.3*  ALBUMIN 3.0* 3.4* 3.5   Recent Labs    03/23/18 0400 04/09/18 1433  04/11/18 0542 04/12/18 0543 04/14/18 0605  WBC 12.0* 22.0*   < >  22.6* 21.1* 18.1*  NEUTROABS 8.1* 15.4*  --   --   --  12.6*  HGB 12.5 13.7   < > 12.4 13.1 12.4  HCT 38.0 39.3   < > 36.9 39.7 37.7  MCV 96.9 93.5   < > 94.4 94.9 93.4  PLT 273 374   < > 377 352 335   < > = values in this interval not displayed.   Lab Results  Component Value Date   TSH 4.82 (H) 09/26/2014    Lab Results  Component Value Date   CHOL 118 04/10/2018   HDL 29 (L) 04/10/2018   LDLCALC 75 04/10/2018   TRIG 72 04/10/2018   CHOLHDL 4.1 04/10/2018    Assessment/Plan  1. Congestive heart failure, unspecified HF chronicity, unspecified heart failure type ([place]) - weight is stable, no SOB, continue Lasix 20 mg 1 tablet daily and metoprolol tartrate 25 mg 1 tablet twice a day    2. Allergic rhinitis, unspecified seasonality, unspecified trigger - continue Fluticasone 50 mcg  2 sprays daily   3. Vitamin D deficiency - continue vitamin D3 1000 units 4 tabs daily   4. Essential hypertension - well-controlled, continue metoprolol tartrate 25 mg 1 tablet twice a day   5. Chronic obstructive pulmonary disease, unspecified COPD type ([place]) - no SOB nor wheezing continue Wixela 100-50 mcg   6.  Dementia without behavioral disturbance - continue memantine 5 mg 1 tab daily supportive care, fall precautions    Family/ staff Communication: Discussed plan of care with resident.  Labs/tests ordered:   None Goals of care:   Long-term care.   Durenda Age, NP [facility] [phone] (Monday-Friday 8:00 a.m. - 5:00 p.m.) [phone] (after hours)

## 2018-07-02 ENCOUNTER — Encounter
Admission: RE | Admit: 2018-07-02 | Discharge: 2018-07-02 | Disposition: A | Discharge status: Home / Self Care | Payer: Medicare Other | Source: Ambulatory Visit | Attending: Internal Medicine | Admitting: Internal Medicine

## 2018-07-20 ENCOUNTER — Non-Acute Institutional Stay (SKILLED_NURSING_FACILITY): Payer: Medicare Other | Admitting: Adult Health

## 2018-07-20 ENCOUNTER — Encounter: Payer: Self-pay | Admitting: Adult Health

## 2018-07-20 DIAGNOSIS — J449 Chronic obstructive pulmonary disease, unspecified: Secondary | ICD-10-CM

## 2018-07-20 DIAGNOSIS — I509 Heart failure, unspecified: Secondary | ICD-10-CM

## 2018-07-20 DIAGNOSIS — F0150 Vascular dementia without behavioral disturbance: Secondary | ICD-10-CM

## 2018-07-20 DIAGNOSIS — I130 Hypertensive heart and chronic kidney disease with heart failure and stage 1 through stage 4 chronic kidney disease, or unspecified chronic kidney disease: Secondary | ICD-10-CM | POA: Diagnosis not present

## 2018-07-20 DIAGNOSIS — I482 Chronic atrial fibrillation, unspecified: Secondary | ICD-10-CM

## 2018-07-20 DIAGNOSIS — H401130 Primary open-angle glaucoma, bilateral, stage unspecified: Secondary | ICD-10-CM

## 2018-07-20 DIAGNOSIS — K5909 Other constipation: Secondary | ICD-10-CM

## 2018-07-20 NOTE — Progress Notes (Signed)
Location:   [facility] Room Number: 310A Place of Service:  SNF (31)   CODE STATUS: DNR  No Known Allergies  Chief Complaint  Patient presents with  . Medical Management of Chronic Issues    Chf; hypertensive heart disease; afib.     HPI:  She is a 84 year old long term resident of this facility being seen for the management of her chronic illnesses: chf hypertensive heart disease; afib. She denies any chest pain or palpitations. No changes in appetite; no anxiety.   Past Medical History:  Diagnosis Date  . A-fib ([place])   . Cataract cortical, senile   . Chicken pox   . Colon adenoma    unspecified  . COPD (chronic obstructive pulmonary disease) ([place])   . Diverticulosis   . Glaucoma   . Hemorrhoids   . Hypertension   . Lumbar degenerative disc disease   . Mitral valve prolapse   . Osteoporosis   . Palpitations     Past Surgical History:  Procedure Laterality Date  . BREAST LUMPECTOMY    . CATARACT EXTRACTION Left   . CHOLECYSTECTOMY    . COLONOSCOPY  08/2000  . CORRECTION HAMMER TOE    . INTRAMEDULLARY (IM) NAIL INTERTROCHANTERIC Right 03/07/2018   Procedure: INTRAMEDULLARY (IM) NAIL INTERTROCHANTRIC;  Surgeon: Lovell Sheehan, MD;  Location: [facility] ORS;  Service: Orthopedics;  Laterality: Right;    Social History   Socioeconomic History  . Marital status: Widowed    Spouse name: Not on file  . Number of children: Not on file  . Years of education: Not on file  . Highest education level: Not on file  Occupational History  . Not on file  Social Needs  . Financial resource strain: Not on file  . Food insecurity:    Worry: Not on file    Inability: Not on file  . Transportation needs:    Medical: Not on file    Non-medical: Not on file  Tobacco Use  . Smoking status: Never Smoker  . Smokeless tobacco: Never Used  Substance and Sexual Activity  . Alcohol use: Not Currently  . Drug use: Not on file  . Sexual activity: Not on  file  Lifestyle  . Physical activity:    Days per week: Not on file    Minutes per session: Not on file  . Stress: Not on file  Relationships  . Social connections:    Talks on phone: Not on file    Gets together: Not on file    Attends religious service: Not on file    Active member of club or organization: Not on file    Attends meetings of clubs or organizations: Not on file    Relationship status: Not on file  . Intimate partner violence:    Fear of current or ex partner: Not on file    Emotionally abused: Not on file    Physically abused: Not on file    Forced sexual activity: Not on file  Other Topics Concern  . Not on file  Social History Narrative  . Not on file   Family History  Problem Relation Age of Onset  . Pancreatic cancer Mother   . Emphysema Sister   . Glaucoma Sister       VITAL SIGNS BP 128/66   Pulse 72   Temp 97.6 F (36.4 C) (Oral)   Resp 20   Ht 5\' 6"  (1.676 m)   Wt 118  lb 12.8 oz (53.9 kg)   SpO2 96%   BMI 19.17 kg/m   Outpatient Encounter Medications as of 07/20/2018  Medication Sig  . acetaminophen (TYLENOL) 325 MG tablet Take 650 mg by mouth every 4 (four) hours as needed for pain. pain/fever- do not exceed 3000mg  in 24hr period  . alum & mag hydroxide-simeth (MAALOX PLUS) 400-400-40 MG/5ML suspension Take 30 mLs by mouth every 4 (four) hours as needed.  Marland Kitchen aspirin EC 81 MG tablet Take 81 mg by mouth at bedtime.   . bisacodyl (BISACODYL) 5 MG EC tablet Take 5 mg by mouth daily as needed for moderate constipation.  . calcium-vitamin D (OSCAL WITH D) 500-200 MG-UNIT tablet Take 1 tablet by mouth daily with breakfast.  . Cholecalciferol 1000 units tablet Take 4,000 Units by mouth daily.   . diphenhydrAMINE-zinc acetate (BENADRYL) cream Apply 1 application topically 2 (two) times daily as needed for itching. Apply to itchy skin  . feeding supplement, ENSURE ENLIVE, (ENSURE ENLIVE) LIQD Take 237 mLs by mouth 2 (two) times daily between meals.    . fluticasone (FLONASE) 50 MCG/ACT nasal spray Place 2 sprays into the nose daily.   . Fluticasone-Salmeterol (WIXELA INHUB) 100-50 MCG/DOSE AEPB Inhale 1 puff into the lungs 2 (two) times daily.  . furosemide (LASIX) 20 MG tablet Take 1 tablet (20 mg total) by mouth daily.  Marland Kitchen HYDROcodone-acetaminophen (NORCO/VICODIN) 5-325 MG tablet Take 1 tablet by mouth every 6 (six) hours as needed for moderate pain or severe pain.  . Melatonin 5 MG TABS Take 5 mg by mouth at bedtime as needed for sleep.  . memantine (NAMENDA) 5 MG tablet Take 5 mg by mouth daily.   . metoprolol tartrate (LOPRESSOR) 25 MG tablet Take 25 mg by mouth 2 (two) times daily.   . Multiple Vitamins-Minerals (SENIOR TABS) TABS Take 1 tablet by mouth daily.   . NON FORMULARY Diet Type: Regular with chopped meat and added gravy  . sennosides-docusate sodium (SENOKOT-S) 8.6-50 MG tablet Take 1 tablet by mouth daily.   . Skin Protectants, Misc. (ENDIT EX) Apply topically. apply thick layer to denuded area left buttock TID and prn.  . Travoprost, BAK Free, (TRAVATAN Z) 0.004 % SOLN ophthalmic solution Apply 1 drop to eye at bedtime.    No facility-administered encounter medications on file as of 07/20/2018.      SIGNIFICANT DIAGNOSTIC EXAMS  LABS REVIEWED TODAY:   04-10-18: wbc 20.2; hgb 12.8; hc6 39.4; mcv 93.;9 plt 376; glucose 105; bun 27; creat 0.72; k+ 3.8; na++  135 ca 8.2 chol 118; ldl 75; trig 72; hdl 29  04-14-18: wbc 18.1; hgb 12.4; hct 37.7; mcv 93.;4 plt 335; glucose 103; bun 23; creat 0.56; k+ 3.2; na++ 134; ca 7.8   Review of Systems  Constitutional: Negative for malaise/fatigue.  HENT:       Is hoh  Respiratory: Negative for cough and shortness of breath.   Cardiovascular: Negative for chest pain, palpitations and leg swelling.  Gastrointestinal: Negative for abdominal pain, constipation and heartburn.  Musculoskeletal: Negative for back pain, joint pain and myalgias.  Skin: Negative.   Neurological: Negative for  dizziness.  Psychiatric/Behavioral: The patient is not nervous/anxious.     Physical Exam  Constitutional: She is oriented to person, place, and time. She appears well-developed and well-nourished. No distress.  Thin   Neck: No thyromegaly present.  Cardiovascular: Normal rate, regular rhythm, normal heart sounds and intact distal pulses.  Pulmonary/Chest: Effort normal and breath sounds normal. No respiratory  distress.  Abdominal: Soft. Bowel sounds are normal. She exhibits no distension. There is no tenderness.  Musculoskeletal: Normal range of motion. She exhibits no edema.  Wearing ted hose   Lymphadenopathy:    She has no cervical adenopathy.  Neurological: She is alert and oriented to person, place, and time.  Skin: Skin is warm and dry. She is not diaphoretic.  Psychiatric: She has a normal mood and affect.      ASSESSMENT/ PLAN:  TODAY:   1. CHF: is stable will continue lasix 20 mg daily lopressor 25 mg twice daily   2. Hypertensive heart disease with congestive heart failure: is stable b/p 128/66: will continue lopressor 25 mg twice daily asa 81 mg daily   3. Chronic atrial fibrillation: heart reate stable will continue asa 81 mg daily  4.  COPD: is stable will continue wixela inhub 100/50 twice daily and flonase nightly   5.  Chronic constipation: is stable will continue senna s daily   6. Bilateral chronic glaucoma: is stable will continue travatan to eyes nightly   7.  Vascular dementia without behavioral disturbance: is without change weight is 118 pounds; will continue namenda 5 mg daily   8. Right hip fracture: is status post IM nailing: is stable will continue vicodin 5/325 mg every 6 hours as needed     MD is aware of resident's narcotic use and is in agreement with current plan of care. We will attempt to wean resident as apropriate   Ok Edwards NP [facility]  Contact [phone] Monday through Friday 8am- 5pm  After hours call  [phone]

## 2018-07-29 DIAGNOSIS — K5909 Other constipation: Secondary | ICD-10-CM | POA: Insufficient documentation

## 2018-07-29 DIAGNOSIS — I482 Chronic atrial fibrillation, unspecified: Secondary | ICD-10-CM | POA: Insufficient documentation

## 2018-07-29 DIAGNOSIS — H401190 Primary open-angle glaucoma, unspecified eye, stage unspecified: Secondary | ICD-10-CM | POA: Insufficient documentation

## 2018-07-29 DIAGNOSIS — I130 Hypertensive heart and chronic kidney disease with heart failure and stage 1 through stage 4 chronic kidney disease, or unspecified chronic kidney disease: Secondary | ICD-10-CM | POA: Insufficient documentation

## 2018-07-29 DIAGNOSIS — H402290 Chronic angle-closure glaucoma, unspecified eye, stage unspecified: Secondary | ICD-10-CM | POA: Insufficient documentation

## 2018-07-29 DIAGNOSIS — F0150 Vascular dementia without behavioral disturbance: Secondary | ICD-10-CM | POA: Insufficient documentation

## 2018-07-29 HISTORY — DX: Chronic atrial fibrillation, unspecified: I48.20

## 2018-08-01 ENCOUNTER — Encounter
Admission: RE | Admit: 2018-08-01 | Discharge: 2018-08-01 | Disposition: A | Discharge status: Home / Self Care | Payer: Medicare Other | Source: Ambulatory Visit | Attending: Internal Medicine | Admitting: Internal Medicine

## 2018-08-01 DIAGNOSIS — I509 Heart failure, unspecified: Secondary | ICD-10-CM | POA: Insufficient documentation

## 2018-08-01 DIAGNOSIS — I110 Hypertensive heart disease with heart failure: Secondary | ICD-10-CM | POA: Insufficient documentation

## 2018-08-18 ENCOUNTER — Non-Acute Institutional Stay (SKILLED_NURSING_FACILITY): Payer: Medicare Other | Admitting: Adult Health

## 2018-08-18 ENCOUNTER — Encounter: Payer: Self-pay | Admitting: Adult Health

## 2018-08-18 DIAGNOSIS — J449 Chronic obstructive pulmonary disease, unspecified: Secondary | ICD-10-CM | POA: Diagnosis not present

## 2018-08-18 DIAGNOSIS — I509 Heart failure, unspecified: Secondary | ICD-10-CM | POA: Diagnosis not present

## 2018-08-18 DIAGNOSIS — R627 Adult failure to thrive: Secondary | ICD-10-CM

## 2018-08-18 DIAGNOSIS — F0150 Vascular dementia without behavioral disturbance: Secondary | ICD-10-CM

## 2018-08-18 DIAGNOSIS — I482 Chronic atrial fibrillation, unspecified: Secondary | ICD-10-CM

## 2018-08-18 NOTE — Progress Notes (Signed)
[facility] Room Number: 310-A Place of Service:  SNF (31) Provider:  Durenda Age, NP  Patient Care Team: [place] Pink, MD as PCP - General (Family Medicine)  Extended Emergency Contact Information Primary Emergency Contact: Keamber, Macfadden Address: [address]          [place], [place] [address] [name] Phone: [phone] Mobile Phone: [phone] Relation: Son Secondary Emergency Contact: Bacchi,[name] Address: [address]          [place], [place] [address] [name] Phone: [phone] Mobile Phone: [phone] Relation: Relative  Code Status:  DNR  Goals of care: Advanced Directive information Advanced Directives 08/18/2018  Does Patient Have a Medical Advance Directive? Yes  Type of Advance Directive Out of facility DNR (pink MOST or yellow form)  Does patient want to make changes to medical advance directive? No - Patient declined  Copy of [place] in Chart? No - copy requested  Pre-existing out of facility DNR order (yellow form or pink MOST form) -     Chief Complaint  Patient presents with  . Medical Management of Chronic Issues    Routine [facility] visit    HPI:  Gina Blankenship is a 84 y.o. female seen today for medical management of chronic diseases.  She is a long-term care resident at [facility].  She has a PMH of COPD, dementia, hypertensive heart disease with heart failure, atrial fibrillation, and glaucoma. She was seen in her room today. She is hard of hearing. She said that she used to be in the [employer] while her husband was in the Corporate treasurer. No reported SOB.    Past Medical History:  Diagnosis Date  . A-fib ([place])   . Cataract cortical, senile   . Chicken pox   . Colon adenoma    unspecified  . COPD (chronic obstructive pulmonary disease) ([place])   . Diverticulosis   . Glaucoma   . Hemorrhoids   . Hypertension   . Lumbar  degenerative disc disease   . Mitral valve prolapse   . Osteoporosis   . Palpitations    Past Surgical History:  Procedure Laterality Date  . BREAST LUMPECTOMY    . CATARACT EXTRACTION Left   . CHOLECYSTECTOMY    . COLONOSCOPY  08/2000  . CORRECTION HAMMER TOE    . INTRAMEDULLARY (IM) NAIL INTERTROCHANTERIC Right 03/07/2018   Procedure: INTRAMEDULLARY (IM) NAIL INTERTROCHANTRIC;  Surgeon: Lovell Sheehan, MD;  Location: [facility] ORS;  Service: Orthopedics;  Laterality: Right;    No Known Allergies  Outpatient Encounter Medications as of 08/18/2018  Medication Sig  . acetaminophen (TYLENOL) 325 MG tablet Take 650 mg by mouth every 4 (four) hours as needed for pain. pain/fever- do not exceed 3000mg  in 24hr period  . alum & mag hydroxide-simeth (MAALOX PLUS) 400-400-40 MG/5ML suspension Take 30 mLs by mouth every 4 (four) hours as needed.  Marland Kitchen aspirin EC 81 MG tablet Take 81 mg by mouth at bedtime.   . bisacodyl (BISACODYL) 5 MG EC tablet Take 5 mg by mouth daily as needed for moderate constipation.  . calcium-vitamin D (OSCAL WITH D) 500-200 MG-UNIT tablet Take 1 tablet by mouth daily with breakfast.  . Cholecalciferol 1000 units tablet Take 4,000 Units by mouth daily.   . diphenhydrAMINE-zinc acetate (BENADRYL) cream Apply 1 application topically 2 (two) times daily as needed for itching. Apply to itchy skin  . feeding supplement, ENSURE ENLIVE, (ENSURE ENLIVE)  LIQD Take 237 mLs by mouth 2 (two) times daily between meals.  . fluticasone (FLONASE) 50 MCG/ACT nasal spray Place 2 sprays into the nose daily.   . Fluticasone-Salmeterol (WIXELA INHUB) 100-50 MCG/DOSE AEPB Inhale 1 puff into the lungs 2 (two) times daily.  . furosemide (LASIX) 20 MG tablet Take 1 tablet (20 mg total) by mouth daily.  . Melatonin 5 MG TABS Take 5 mg by mouth at bedtime as needed for sleep.  . memantine (NAMENDA) 5 MG tablet Take 5 mg by mouth daily.   . metoprolol tartrate (LOPRESSOR) 25 MG tablet Take 25 mg by mouth  2 (two) times daily.   . Multiple Vitamins-Minerals (SENIOR TABS) TABS Take 1 tablet by mouth daily.   . NON FORMULARY Diet Type: Regular with chopped meat and added gravy  . Skin Protectants, Misc. (ENDIT EX) Apply topically. apply thick layer to denuded area left buttock TID and prn.  . Travoprost, BAK Free, (TRAVATAN Z) 0.004 % SOLN ophthalmic solution Apply 1 drop to eye at bedtime.   . [DISCONTINUED] HYDROcodone-acetaminophen (NORCO/VICODIN) 5-325 MG tablet Take 1 tablet by mouth every 6 (six) hours as needed for moderate pain or severe pain.  . [DISCONTINUED] sennosides-docusate sodium (SENOKOT-S) 8.6-50 MG tablet Take 1 tablet by mouth daily.    No facility-administered encounter medications on file as of 08/18/2018.     Review of Systems  GENERAL: No change in appetite, no fatigue, no weight changes, no fever, chills or weakness EARS: +hard of hearing RESPIRATORY: no cough, SOB, DOE, wheezing, hemoptysis CARDIAC: No chest pain, edema or palpitations GI: No abdominal pain, diarrhea, constipation, heart burn, nausea or vomiting NEUROLOGICAL: Denies dizziness, syncope, numbness, or headache PSYCHIATRIC: Denies feelings of depression or anxiety. No report of hallucinations, insomnia, paranoia, or agitation   Immunization History  Administered Date(s) Administered  . Influenza Inj Mdck Quad Pf 07/12/2016  . Influenza Split 08/23/2014, 08/03/2018  . Pneumococcal Polysaccharide-23 08/23/2014   Pertinent  Health Maintenance Due  Topic Date Due  . PNA vac Low Risk Adult (2 of 2 - PCV13) 06/19/2024 (Originally 08/24/2015)  . INFLUENZA VACCINE  Completed  . DEXA SCAN  Discontinued      Vitals:   08/18/18 1015  BP: 130/68  Pulse: 76  Resp: (!) 28  Temp: 97.8 F (36.6 C)  TempSrc: Oral  SpO2: 96%  Weight: 117 lb 8 oz (53.3 kg)  Height: 5\' 6"  (1.676 m)   Body mass index is 18.96 kg/m.  Physical Exam  GENERAL APPEARANCE:  In no acute distress.  SKIN:  Skin is warm and  dry.  EARS: Pinnae are normal. Hard of hearing MOUTH and THROAT: Lips are without lesions. Oral mucosa is moist and without lesions. Tongue is normal in shape, size, and color and without lesions RESPIRATORY: Breathing is even & unlabored, BS CTAB CARDIAC: Irregularly irregular, no murmur,no extra heart sounds, no edema GI: Abdomen soft, normal BS, no masses, no tenderness EXTREMITIES:  Able to move X 4 extremities NEUROLOGICAL: There is no tremor. Speech is clear. Alert to self, disoriented to time and place. PSYCHIATRIC:  Affect and behavior are appropriate   Labs reviewed: Recent Labs    04/11/18 0542 04/12/18 0543 04/14/18 0605  NA 133* 136 134*  K 3.8 3.5 3.2*  CL 98* 102 99*  CO2 25 27 27   GLUCOSE 118* 116* 103*  BUN 20 20 23*  CREATININE 0.69 0.60 0.56  CALCIUM 8.2* 8.3* 7.8*   Recent Labs    03/09/18 0749 03/23/18  0400 04/09/18 1433  AST 28 23 43*  ALT 19 21 20   ALKPHOS 71 116 136*  BILITOT 1.7* 2.0* 2.8*  PROT 5.4* 6.1* 6.3*  ALBUMIN 3.0* 3.4* 3.5   Recent Labs    03/23/18 0400 04/09/18 1433  04/11/18 0542 04/12/18 0543 04/14/18 0605  WBC 12.0* 22.0*   < > 22.6* 21.1* 18.1*  NEUTROABS 8.1* 15.4*  --   --   --  12.6*  HGB 12.5 13.7   < > 12.4 13.1 12.4  HCT 38.0 39.3   < > 36.9 39.7 37.7  MCV 96.9 93.5   < > 94.4 94.9 93.4  PLT 273 374   < > 377 352 335   < > = values in this interval not displayed.   Lab Results  Component Value Date   TSH 4.82 (H) 09/26/2014    Lab Results  Component Value Date   CHOL 118 04/10/2018   HDL 29 (L) 04/10/2018   LDLCALC 75 04/10/2018   TRIG 72 04/10/2018   CHOLHDL 4.1 04/10/2018     Assessment/Plan  1. Congestive heart failure, unspecified HF chronicity, unspecified heart failure type ([place]) -stable, continue Lasix 20 mg 1 tab daily   2. Chronic obstructive pulmonary disease, unspecified COPD type ([place]) -no wheezing nor SOB, continue Wixela Inhub  I puff inhalation BID   3. Failure to thrive in adult -  Body mass index is 18.96 kg/m.  Continue Ensure 237 mL twice a day Last Weight  Most recent update: 08/18/2018 10:19 AM   Weight  53.3 kg (117 lb 8 oz)            4. Chronic atrial fibrillation -rate controlled, continue metoprolol tartrate 25 mg 1 tab twice a day   5. Vascular dementia without behavioral disturbance ([place]) - continue memantine 5 mg 1 tab daily supportive care,    Family/ staff Communication: Discussed plan of care with resident.  Labs/tests ordered: None  Goals of care:   Long-term care.   Durenda Age, NP [facility] [phone] (Monday-Friday 8:00 a.m. - 5:00 p.m.) [phone] (after hours)

## 2018-08-29 ENCOUNTER — Other Ambulatory Visit
Admission: RE | Admit: 2018-08-29 | Discharge: 2018-08-29 | Disposition: A | Discharge status: Home / Self Care | Payer: Medicare Other | Source: Ambulatory Visit | Attending: Adult Health | Admitting: Adult Health

## 2018-08-29 DIAGNOSIS — I110 Hypertensive heart disease with heart failure: Secondary | ICD-10-CM | POA: Diagnosis not present

## 2018-08-29 DIAGNOSIS — I509 Heart failure, unspecified: Secondary | ICD-10-CM | POA: Diagnosis not present

## 2018-08-29 LAB — BASIC METABOLIC PANEL
ANION GAP: 10 (ref 5–15)
BUN: 22 mg/dL (ref 8–23)
CO2: 30 mmol/L (ref 22–32)
Calcium: 8.7 mg/dL — ABNORMAL LOW (ref 8.9–10.3)
Chloride: 101 mmol/L (ref 98–111)
Creatinine, Ser: 0.73 mg/dL (ref 0.44–1.00)
GFR calc Af Amer: >60 mL/min (ref >60)
GFR calc non Af Amer: >60 mL/min (ref >60)
GLUCOSE: 101 mg/dL — AB (ref 70–99)
POTASSIUM: 4.2 mmol/L (ref 3.5–5.1)
Sodium: 141 mmol/L (ref 135–145)

## 2018-09-01 ENCOUNTER — Encounter
Admission: RE | Admit: 2018-09-01 | Discharge: 2018-09-01 | Disposition: A | Discharge status: Home / Self Care | Payer: Medicare Other | Source: Ambulatory Visit | Attending: Internal Medicine | Admitting: Internal Medicine

## 2018-09-01 DIAGNOSIS — I509 Heart failure, unspecified: Secondary | ICD-10-CM | POA: Insufficient documentation

## 2018-09-01 DIAGNOSIS — I110 Hypertensive heart disease with heart failure: Secondary | ICD-10-CM | POA: Insufficient documentation

## 2018-09-20 ENCOUNTER — Non-Acute Institutional Stay (SKILLED_NURSING_FACILITY): Payer: Medicare Other | Admitting: Adult Health

## 2018-09-20 ENCOUNTER — Encounter: Payer: Self-pay | Admitting: Adult Health

## 2018-09-20 DIAGNOSIS — K5909 Other constipation: Secondary | ICD-10-CM

## 2018-09-20 DIAGNOSIS — I130 Hypertensive heart and chronic kidney disease with heart failure and stage 1 through stage 4 chronic kidney disease, or unspecified chronic kidney disease: Secondary | ICD-10-CM

## 2018-09-20 DIAGNOSIS — F0150 Vascular dementia without behavioral disturbance: Secondary | ICD-10-CM | POA: Diagnosis not present

## 2018-09-20 DIAGNOSIS — I482 Chronic atrial fibrillation, unspecified: Secondary | ICD-10-CM

## 2018-09-20 DIAGNOSIS — I509 Heart failure, unspecified: Secondary | ICD-10-CM | POA: Diagnosis not present

## 2018-09-20 DIAGNOSIS — J449 Chronic obstructive pulmonary disease, unspecified: Secondary | ICD-10-CM

## 2018-09-20 NOTE — Progress Notes (Signed)
Provider:  Ok Edwards, NP [facility] Room Number: [place] of Service:  SNF (31)   PCP: Maryland Pink, MD Patient Care Team: [place] Pink, MD as PCP - General (Family Medicine) Kirk Ruths, MD (Internal Medicine) Nyoka Cowden Phylis Bougie, NP as Nurse Practitioner (Geriatric Medicine)  Extended Emergency Contact Information Primary Emergency Contact: Fany, Cavanaugh Address: [address]          [place], [place] [address] Johnnette Litter of [place] Phone: [phone] Mobile Phone: [phone] Relation: Son Secondary Emergency Contact: Greathouse,[name] Address: [address]          [place], [place] [address] Johnnette Litter of [place] Phone: [phone] Mobile Phone: [phone] Relation: Relative  Code Status: DNR Goals of Care: Advanced Directive information Advanced Directives 09/20/2018  Does Patient Have a Medical Advance Directive? Yes  Type of Advance Directive Out of facility DNR (pink MOST or yellow form)  Does patient want to make changes to medical advance directive? No - Patient declined  Copy of [place] in Chart? -  Pre-existing out of facility DNR order (yellow form or pink MOST form) Yellow form placed in chart (order not valid for inpatient use)      No Known Allergies   Chief Complaint  Patient presents with  . Annual Exam        HPI: Patient is a 84 y.o. female seen today for an annual comprehensive examination. Sh has been hospitalized in June of this year for chf; sepsis due to uti. She has not required hospitalizations since then. She denies any uncontrolled pain; no cough or shortness of breath; no changes in appetite. She will continue to be followed for her chronic illnesses including: dementia; chf; hypertension.   Past Medical History:  Diagnosis Date  . A-fib ([place])   . Cataract cortical, senile   . Chicken pox   . Colon adenoma    unspecified  . COPD  (chronic obstructive pulmonary disease) ([place])   . Diverticulosis   . Glaucoma   . Hemorrhoids   . Hypertension   . Lumbar degenerative disc disease   . Mitral valve prolapse   . Osteoporosis   . Palpitations    Past Surgical History:  Procedure Laterality Date  . BREAST LUMPECTOMY    . CATARACT EXTRACTION Left   . CHOLECYSTECTOMY    . COLONOSCOPY  08/2000  . CORRECTION HAMMER TOE    . INTRAMEDULLARY (IM) NAIL INTERTROCHANTERIC Right 03/07/2018   Procedure: INTRAMEDULLARY (IM) NAIL INTERTROCHANTRIC;  Surgeon: Lovell Sheehan, MD;  Location: [facility] ORS;  Service: Orthopedics;  Laterality: Right;    reports that she has never smoked. She has never used smokeless tobacco. She reports that she drank alcohol. Her drug history is not on file. Social History   Socioeconomic History  . Marital status: Widowed    Spouse name: Not on file  . Number of children: Not on file  . Years of education: Not on file  . Highest education level: Not on file  Occupational History  . Not on file  Social Needs  . Financial resource strain: Not on file  . Food insecurity:    Worry: Not on file    Inability: Not on file  . Transportation needs:    Medical: Not on file    Non-medical: Not on file  Tobacco Use  . Smoking status: Never Smoker  . Smokeless tobacco: Never Used  Substance and Sexual Activity  . Alcohol  use: Not Currently  . Drug use: Not on file  . Sexual activity: Not on file  Lifestyle  . Physical activity:    Days per week: Not on file    Minutes per session: Not on file  . Stress: Not on file  Relationships  . Social connections:    Talks on phone: Not on file    Gets together: Not on file    Attends religious service: Not on file    Active member of club or organization: Not on file    Attends meetings of clubs or organizations: Not on file    Relationship status: Not on file  . Intimate partner violence:    Fear of current or ex partner: Not on file    Emotionally  abused: Not on file    Physically abused: Not on file    Forced sexual activity: Not on file  Other Topics Concern  . Not on file  Social History Narrative  . Not on file   Family History  Problem Relation Age of Onset  . Pancreatic cancer Mother   . Emphysema Sister   . Glaucoma Sister     Vitals:   09/20/18 1336  BP: 113/85  Pulse: 92  Resp: (!) 28  Temp: 98.1 F (36.7 C)  SpO2: 100%  Weight: 117 lb 1.6 oz (53.1 kg)  Height: 5\' 6"  (1.676 m)   Body mass index is 18.9 kg/m.  Allergies as of 09/20/2018   No Known Allergies     Medication List        Accurate as of 09/20/18 11:59 PM. Always use your most recent med list.          acetaminophen 325 MG tablet Commonly known as:  TYLENOL Take 650 mg by mouth every 4 (four) hours as needed for pain. pain/fever- do not exceed 3000mg  in 24hr period   alum & mag hydroxide-simeth 400-400-40 MG/5ML suspension Commonly known as:  MAALOX PLUS Take 30 mLs by mouth every 4 (four) hours as needed.   aspirin EC 81 MG tablet Take 81 mg by mouth at bedtime.   bisacodyl 5 MG EC tablet Generic drug:  bisacodyl Take 5 mg by mouth daily as needed for moderate constipation.   calcium-vitamin D 500-200 MG-UNIT tablet Commonly known as:  OSCAL WITH D Take 1 tablet by mouth daily with breakfast.   Cholecalciferol 25 MCG (1000 UT) tablet Take 4,000 Units by mouth daily.   diphenhydrAMINE-zinc acetate cream Commonly known as:  BENADRYL Apply 1 application topically 2 (two) times daily as needed for itching. Apply to itchy skin   ENDIT EX Apply topically. apply thick layer to denuded area left buttock TID and prn.   feeding supplement (ENSURE ENLIVE) Liqd Take 237 mLs by mouth 2 (two) times daily between meals.   fluticasone 50 MCG/ACT nasal spray Commonly known as:  FLONASE Place 2 sprays into the nose daily.   furosemide 20 MG tablet Commonly known as:  LASIX Take 1 tablet (20 mg total) by mouth daily.   magnesium  hydroxide 400 MG/5ML suspension Commonly known as:  MILK OF MAGNESIA Take 30 mLs by mouth every 4 (four) hours as needed for mild constipation. If no BM for 2 days   Melatonin 5 MG Tabs Take 5 mg by mouth at bedtime as needed for sleep.   memantine 5 MG tablet Commonly known as:  NAMENDA Take 5 mg by mouth daily.   metoprolol tartrate 25 MG tablet Commonly known as:  LOPRESSOR Take 25 mg by mouth 2 (two) times daily.   NON FORMULARY Diet Type: Regular with chopped meat and added gravy   SENIOR TABS Tabs Take 1 tablet by mouth daily.   sennosides-docusate sodium 8.6-50 MG tablet Commonly known as:  SENOKOT-S Take 1 tablet by mouth daily.   TRAVATAN Z 0.004 % Soln ophthalmic solution Generic drug:  Travoprost (BAK Free) Apply 1 drop to eye at bedtime.   WIXELA INHUB 100-50 MCG/DOSE Aepb Generic drug:  Fluticasone-Salmeterol Inhale 1 puff into the lungs 2 (two) times daily.        SIGNIFICANT DIAGNOSTIC EXAMS  LABS REVIEWED PREVIOUS:   04-10-18: wbc 20.2; hgb 12.8; hc6 39.4; mcv 93.;9 plt 376; glucose 105; bun 27; creat 0.72; k+ 3.8; na++  135 ca 8.2 chol 118; ldl 75; trig 72; hdl 29  04-14-18: wbc 18.1; hgb 12.4; hct 37.7; mcv 93.;4 plt 335; glucose 103; bun 23; creat 0.56; k+ 3.2; na++ 134; ca 7.8   TODAY;   08-29-18: glucose 101; bun 22; creat 0.73; k+ 4.2; na++ 141; ca 8.7    Review of Systems  Constitutional: Negative for malaise/fatigue.  HENT:       Is HOH   Respiratory: Negative for cough and shortness of breath.   Cardiovascular: Negative for chest pain, palpitations and leg swelling.  Gastrointestinal: Negative for abdominal pain, constipation and heartburn.  Musculoskeletal: Negative for back pain, joint pain and myalgias.  Skin: Negative.   Neurological: Negative for dizziness.  Psychiatric/Behavioral: The patient is not nervous/anxious.     Physical Exam  Constitutional: She appears well-developed and well-nourished. No distress.  Thin   Neck:  No thyromegaly present.  Cardiovascular: Normal rate, regular rhythm, normal heart sounds and intact distal pulses.  Pulmonary/Chest: Effort normal and breath sounds normal. No respiratory distress.  Abdominal: Soft. Bowel sounds are normal. She exhibits no distension. There is no tenderness.  Musculoskeletal: Normal range of motion. She exhibits no edema.  Wearing ted hose   Lymphadenopathy:    She has no cervical adenopathy.  Neurological: She is alert.  Skin: Skin is warm and dry. She is not diaphoretic.  Psychiatric: She has a normal mood and affect.     ASSESSMENT/ PLAN:  TODAY:   1. CHF: is stable will continue lasix 20 mg daily lopressor 25 mg twice daily   2. Hypertensive heart disease with congestive heart failure: is stable b/p 113/85: will continue lopressor 25 mg twice daily asa 81 mg daily   3. Chronic atrial fibrillation: heart reate stable will continue asa 81 mg daily  4.  COPD: is stable will continue wixela inhub 100/50 twice daily and flonase nightly   5.  Chronic constipation: is stable will continue senna s daily   6. Bilateral chronic glaucoma: is stable will continue travatan to eyes nightly   7.  Vascular dementia without behavioral disturbance: is without change weight is 117 (previous 118) pounds; will continue namenda 5 mg daily    Health care maintenance is up to date    MD is aware of resident's narcotic use and is in agreement with current plan of care. We will wean dosage as appropriate for resident   Ok Edwards NP [facility]  Contact [phone] Monday through Friday 8am- 5pm  After hours call [phone]

## 2018-09-21 ENCOUNTER — Encounter: Payer: Self-pay | Admitting: Adult Health

## 2018-09-27 ENCOUNTER — Encounter: Payer: Self-pay | Admitting: Adult Health

## 2018-09-27 ENCOUNTER — Other Ambulatory Visit: Payer: Self-pay | Admitting: Adult Health

## 2018-09-27 MED ORDER — LORAZEPAM 0.5 MG PO TABS: 0.5000 mg | ORAL_TABLET | Freq: Once | ORAL | 0 refills | Status: AC

## 2018-09-27 NOTE — Progress Notes (Signed)
[facility]   [facility] Room Number: [place] of Service:  SNF (31)   CODE STATUS: DNR  No Known Allergies  Chief Complaint  Patient presents with  . Acute Visit    Cough / Congestion    HPI:    Past Medical History:  Diagnosis Date  . A-fib ([place])   . Cataract cortical, senile   . Chicken pox   . Colon adenoma    unspecified  . COPD (chronic obstructive pulmonary disease) ([place])   . Diverticulosis   . Glaucoma   . Hemorrhoids   . Hypertension   . Lumbar degenerative disc disease   . Mitral valve prolapse   . Osteoporosis   . Palpitations     Past Surgical History:  Procedure Laterality Date  . BREAST LUMPECTOMY    . CATARACT EXTRACTION Left   . CHOLECYSTECTOMY    . COLONOSCOPY  08/2000  . CORRECTION HAMMER TOE    . INTRAMEDULLARY (IM) NAIL INTERTROCHANTERIC Right 03/07/2018   Procedure: INTRAMEDULLARY (IM) NAIL INTERTROCHANTRIC;  Surgeon: Lovell Sheehan, MD;  Location: [facility] ORS;  Service: Orthopedics;  Laterality: Right;    Social History   Socioeconomic History  . Marital status: Widowed    Spouse name: Not on file  . Number of children: Not on file  . Years of education: Not on file  . Highest education level: Not on file  Occupational History  . Not on file  Social Needs  . Financial resource strain: Not on file  . Food insecurity:    Worry: Not on file    Inability: Not on file  . Transportation needs:    Medical: Not on file    Non-medical: Not on file  Tobacco Use  . Smoking status: Never Smoker  . Smokeless tobacco: Never Used  Substance and Sexual Activity  . Alcohol use: Not Currently  . Drug use: Not on file  . Sexual activity: Not on file  Lifestyle  . Physical activity:    Days per week: Not on file    Minutes per session: Not on file  . Stress: Not on file  Relationships  . Social connections:    Talks on phone: Not on file    Gets together: Not on file    Attends religious service: Not on file     Active member of club or organization: Not on file    Attends meetings of clubs or organizations: Not on file    Relationship status: Not on file  . Intimate partner violence:    Fear of current or ex partner: Not on file    Emotionally abused: Not on file    Physically abused: Not on file    Forced sexual activity: Not on file  Other Topics Concern  . Not on file  Social History Narrative  . Not on file   Family History  Problem Relation Age of Onset  . Pancreatic cancer Mother   . Emphysema Sister   . Glaucoma Sister       VITAL SIGNS BP 120/68   Pulse 73   Temp 98.1 F (36.7 C)   Resp (!) 28   Ht 5\' 6"  (1.676 m)   Wt 116 lb (52.6 kg)   SpO2 93%   BMI 18.72 kg/m   Outpatient Encounter Medications as of 09/27/2018  Medication Sig  . acetaminophen (TYLENOL) 325 MG tablet Take 650 mg by mouth every 4 (four) hours as needed for pain. pain/fever-  do not exceed 3000mg  in 24hr period  . alum & mag hydroxide-simeth (MAALOX PLUS) 400-400-40 MG/5ML suspension Take 30 mLs by mouth every 4 (four) hours as needed.  Marland Kitchen aspirin EC 81 MG tablet Take 81 mg by mouth at bedtime.   . bisacodyl (BISACODYL) 5 MG EC tablet Take 5 mg by mouth daily as needed for moderate constipation.  . calcium-vitamin D (OSCAL WITH D) 500-200 MG-UNIT tablet Take 1 tablet by mouth daily with breakfast.  . Cholecalciferol 1000 units tablet Take 4,000 Units by mouth daily.   . diphenhydrAMINE-zinc acetate (BENADRYL) cream Apply 1 application topically 2 (two) times daily as needed for itching. Apply to itchy skin  . feeding supplement, ENSURE ENLIVE, (ENSURE ENLIVE) LIQD Take 237 mLs by mouth 2 (two) times daily between meals.  . fluticasone (FLONASE) 50 MCG/ACT nasal spray Place 2 sprays into the nose daily.   . Fluticasone-Salmeterol (WIXELA INHUB) 100-50 MCG/DOSE AEPB Inhale 1 puff into the lungs 2 (two) times daily.  . furosemide (LASIX) 20 MG tablet Take 1 tablet (20 mg total) by mouth daily.  Marland Kitchen  guaiFENesin (ROBITUSSIN) 100 MG/5ML liquid Take 200 mg by mouth every 4 (four) hours as needed for cough. 09/26/18 - 09/29/18. Notify provider if cough worsens / continues longer than 3 days  . LORazepam (ATIVAN) 0.5 MG tablet Take 1 tablet (0.5 mg total) by mouth once for 1 dose.  . magnesium hydroxide (MILK OF MAGNESIA) 400 MG/5ML suspension Take 30 mLs by mouth every 4 (four) hours as needed for mild constipation. If no BM for 2 days  . Melatonin 5 MG TABS Take 5 mg by mouth at bedtime as needed for sleep.  . memantine (NAMENDA) 5 MG tablet Take 5 mg by mouth daily.   . metoprolol tartrate (LOPRESSOR) 25 MG tablet Take 25 mg by mouth 2 (two) times daily.   . Multiple Vitamins-Minerals (SENIOR TABS) TABS Take 1 tablet by mouth daily.   . NON FORMULARY Diet Type: Regular with chopped meat and added gravy  . sennosides-docusate sodium (SENOKOT-S) 8.6-50 MG tablet Take 1 tablet by mouth daily.  . Skin Protectants, Misc. (ENDIT EX) Apply topically. apply thick layer to denuded area left buttock TID and prn.  . Travoprost, BAK Free, (TRAVATAN Z) 0.004 % SOLN ophthalmic solution Apply 1 drop to eye at bedtime.    No facility-administered encounter medications on file as of 09/27/2018.      SIGNIFICANT DIAGNOSTIC EXAMS  LABS REVIEWED PREVIOUS:   04-10-18: wbc 20.2; hgb 12.8; hc6 39.4; mcv 93.;9 plt 376; glucose 105; bun 27; creat 0.72; k+ 3.8; na++  135 ca 8.2 chol 118; ldl 75; trig 72; hdl 29  04-14-18: wbc 18.1; hgb 12.4; hct 37.7; mcv 93.;4 plt 335; glucose 103; bun 23; creat 0.56; k+ 3.2; na++ 134; ca 7.8   TODAY;   08-29-18: glucose 101; bun 22; creat 0.73; k+ 4.2; na++ 141; ca 8.7    Review of Systems  Constitutional: Negative for malaise/fatigue.  HENT:       Is HOH   Respiratory: Negative for cough and shortness of breath.   Cardiovascular: Negative for chest pain, palpitations and leg swelling.  Gastrointestinal: Negative for abdominal pain, constipation and heartburn.    Musculoskeletal: Negative for back pain, joint pain and myalgias.  Skin: Negative.   Neurological: Negative for dizziness.  Psychiatric/Behavioral: The patient is not nervous/anxious.     Physical Exam  Constitutional: She appears well-developed and well-nourished. No distress.  Thin   Neck: No thyromegaly  present.  Cardiovascular: Normal rate, regular rhythm, normal heart sounds and intact distal pulses.  Pulmonary/Chest: Effort normal and breath sounds normal. No respiratory distress.  Abdominal: Soft. Bowel sounds are normal. She exhibits no distension. There is no tenderness.  Musculoskeletal: Normal range of motion. She exhibits no edema.  Wearing ted hose   Lymphadenopathy:    She has no cervical adenopathy.  Neurological: She is alert.  Skin: Skin is warm and dry. She is not diaphoretic.  Psychiatric: She has a normal mood and affect.     ASSESSMENT/ PLAN:  TODAY:   1. CHF: is stable will continue lasix 20 mg daily lopressor 25 mg twice daily   2. Hypertensive heart disease with congestive heart failure: is stable b/p 113/85: will continue lopressor 25 mg twice daily asa 81 mg daily   3. Chronic atrial fibrillation: heart reate stable will continue asa 81 mg daily  4.  COPD: is stable will continue wixela inhub 100/50 twice daily and flonase nightly   5.  Chronic constipation: is stable will continue senna s daily   6. Bilateral chronic glaucoma: is stable will continue travatan to eyes nightly   7.  Vascular dementia without behavioral disturbance: is without change weight is 117 (previous 118) pounds; will continue namenda 5 mg daily    Health care maintenance is up to date   MD is aware of resident's narcotic use and is in agreement with current plan of care. We will attempt to wean resident as apropriate   Ok Edwards NP [facility]  Contact [phone] Monday through Friday 8am- 5pm  After hours call [phone]

## 2018-09-30 NOTE — Progress Notes (Signed)
This encounter was created in error - please disregard.

## 2018-10-01 ENCOUNTER — Encounter
Admission: RE | Admit: 2018-10-01 | Discharge: 2018-10-01 | Disposition: A | Discharge status: Home / Self Care | Payer: Medicare Other | Source: Ambulatory Visit | Attending: Internal Medicine | Admitting: Internal Medicine

## 2018-10-01 DIAGNOSIS — I110 Hypertensive heart disease with heart failure: Secondary | ICD-10-CM | POA: Insufficient documentation

## 2018-10-01 DIAGNOSIS — I509 Heart failure, unspecified: Secondary | ICD-10-CM | POA: Insufficient documentation

## 2018-10-04 ENCOUNTER — Other Ambulatory Visit: Payer: Self-pay | Admitting: Adult Health

## 2018-10-04 ENCOUNTER — Encounter: Payer: Self-pay | Admitting: Adult Health

## 2018-10-04 ENCOUNTER — Non-Acute Institutional Stay (SKILLED_NURSING_FACILITY): Payer: Medicare Other | Admitting: Adult Health

## 2018-10-04 DIAGNOSIS — F0150 Vascular dementia without behavioral disturbance: Secondary | ICD-10-CM | POA: Diagnosis not present

## 2018-10-04 DIAGNOSIS — J449 Chronic obstructive pulmonary disease, unspecified: Secondary | ICD-10-CM | POA: Diagnosis not present

## 2018-10-04 DIAGNOSIS — R634 Abnormal weight loss: Secondary | ICD-10-CM | POA: Insufficient documentation

## 2018-10-04 DIAGNOSIS — K5909 Other constipation: Secondary | ICD-10-CM | POA: Diagnosis not present

## 2018-10-04 DIAGNOSIS — H1033 Unspecified acute conjunctivitis, bilateral: Secondary | ICD-10-CM

## 2018-10-04 MED ORDER — DRONABINOL 2.5 MG PO CAPS: 2.5000 mg | ORAL_CAPSULE | Freq: Two times a day (BID) | ORAL | 0 refills | Status: DC

## 2018-10-04 NOTE — Progress Notes (Addendum)
[facility]:   [facility] Room Number: [place] of Service:  SNF (31)   CODE STATUS: DNR  No Known Allergies  Chief Complaint  Patient presents with  . Acute Visit    Care Plan meeting    HPI:  We have come together for her routine care plan meeting her daughter in law is present. Her son has died at thanksgiving. Her weight in July 2019: 118 pounds; 10-04-18: 113 pounds. Her appetite is poor. She has bilateral redness and yellow drainage from both eyes. We have discussed her declining status. Her daughter in law has requested that we start an appetite stimulant. We have reviewed her medications; will stop melatonin. She would benefit lower her lasix to 10 mg daily.   There are no reports of uncontrolled pain. She is starting to socialize since her son's death.   Past Medical History:  Diagnosis Date  . A-fib ([place])   . Cataract cortical, senile   . Chicken pox   . Colon adenoma    unspecified  . COPD (chronic obstructive pulmonary disease) ([place])   . Diverticulosis   . Glaucoma   . Hemorrhoids   . Hypertension   . Lumbar degenerative disc disease   . Mitral valve prolapse   . Osteoporosis   . Palpitations     Past Surgical History:  Procedure Laterality Date  . BREAST LUMPECTOMY    . CATARACT EXTRACTION Left   . CHOLECYSTECTOMY    . COLONOSCOPY  08/2000  . CORRECTION HAMMER TOE    . INTRAMEDULLARY (IM) NAIL INTERTROCHANTERIC Right 03/07/2018   Procedure: INTRAMEDULLARY (IM) NAIL INTERTROCHANTRIC;  Surgeon: Lovell Sheehan, MD;  Location: [facility] ORS;  Service: Orthopedics;  Laterality: Right;    Social History   Socioeconomic History  . Marital status: Widowed    Spouse name: Not on file  . Number of children: Not on file  . Years of education: Not on file  . Highest education level: Not on file  Occupational History  . Not on file  Social Needs  . Financial resource strain: Not on file  . Food insecurity:    Worry: Not on file   Inability: Not on file  . Transportation needs:    Medical: Not on file    Non-medical: Not on file  Tobacco Use  . Smoking status: Never Smoker  . Smokeless tobacco: Never Used  Substance and Sexual Activity  . Alcohol use: Not Currently  . Drug use: Not on file  . Sexual activity: Not on file  Lifestyle  . Physical activity:    Days per week: Not on file    Minutes per session: Not on file  . Stress: Not on file  Relationships  . Social connections:    Talks on phone: Not on file    Gets together: Not on file    Attends religious service: Not on file    Active member of club or organization: Not on file    Attends meetings of clubs or organizations: Not on file    Relationship status: Not on file  . Intimate partner violence:    Fear of current or ex partner: Not on file    Emotionally abused: Not on file    Physically abused: Not on file    Forced sexual activity: Not on file  Other Topics Concern  . Not on file  Social History Narrative  . Not on file   Family History  Problem Relation Age  of Onset  . Pancreatic cancer Mother   . Emphysema Sister   . Glaucoma Sister       VITAL SIGNS BP (!) 112/58   Pulse 74   Ht 5\' 6"  (1.676 m)   Wt 113 lb 14.4 oz (51.7 kg)   BMI 18.38 kg/m   Outpatient Encounter Medications as of 10/04/2018  Medication Sig  . acetaminophen (TYLENOL) 325 MG tablet Take 650 mg by mouth every 4 (four) hours as needed for pain. pain/fever- do not exceed 3000mg  in 24hr period  . alum & mag hydroxide-simeth (MAALOX PLUS) 400-400-40 MG/5ML suspension Take 30 mLs by mouth every 4 (four) hours as needed.  Marland Kitchen aspirin EC 81 MG tablet Take 81 mg by mouth at bedtime.   . bisacodyl (BISACODYL) 5 MG EC tablet Take 5 mg by mouth daily as needed for moderate constipation.  . calcium-vitamin D (OSCAL WITH D) 500-200 MG-UNIT tablet Take 1 tablet by mouth daily with breakfast.  . Cholecalciferol 1000 units tablet Take 4,000 Units by mouth daily.   .  diphenhydrAMINE-zinc acetate (BENADRYL) cream Apply 1 application topically 2 (two) times daily as needed for itching. Apply to itchy skin  . feeding supplement, ENSURE ENLIVE, (ENSURE ENLIVE) LIQD Take 237 mLs by mouth 2 (two) times daily between meals.  . fluticasone (FLONASE) 50 MCG/ACT nasal spray Place 1 spray into both nostrils daily.   . Fluticasone-Salmeterol (WIXELA INHUB) 100-50 MCG/DOSE AEPB Inhale 1 puff into the lungs 2 (two) times daily.  . furosemide (LASIX) 20 MG tablet Take 1 tablet (20 mg total) by mouth daily.  . magnesium hydroxide (MILK OF MAGNESIA) 400 MG/5ML suspension Take 30 mLs by mouth every 4 (four) hours as needed for mild constipation. If no BM for 2 days  . Melatonin 5 MG TABS Take 5 mg by mouth at bedtime as needed for sleep.  . memantine (NAMENDA) 5 MG tablet Take 5 mg by mouth daily.   . metoprolol tartrate (LOPRESSOR) 25 MG tablet Take 25 mg by mouth 2 (two) times daily.   . Multiple Vitamins-Minerals (SENIOR TABS) TABS Take 1 tablet by mouth daily.   . NON FORMULARY Diet Type: Regular with chopped meat and added gravy  . sennosides-docusate sodium (SENOKOT-S) 8.6-50 MG tablet Take 1 tablet by mouth daily.  . Skin Protectants, Misc. (ENDIT EX) Apply topically. apply thick layer to denuded area left buttock TID and prn.  . Travoprost, BAK Free, (TRAVATAN Z) 0.004 % SOLN ophthalmic solution Apply 1 drop to eye at bedtime.    No facility-administered encounter medications on file as of 10/04/2018.      SIGNIFICANT DIAGNOSTIC EXAMS  LABS REVIEWED PREVIOUS:   04-10-18: wbc 20.2; hgb 12.8; hc6 39.4; mcv 93.;9 plt 376; glucose 105; bun 27; creat 0.72; k+ 3.8; na++  135 ca 8.2 chol 118; ldl 75; trig 72; hdl 29  04-14-18: wbc 18.1; hgb 12.4; hct 37.7; mcv 93.;4 plt 335; glucose 103; bun 23; creat 0.56; k+ 3.2; na++ 134; ca 7.8  08-29-18: glucose 101; bun 22; creat 0.73; k+ 4.2; na++ 141; ca 8.7   NO NEW LABS.   Review of Systems  Constitutional: Negative for  malaise/fatigue.  HENT:       Is  HOH  Respiratory: Negative for cough and shortness of breath.   Cardiovascular: Negative for chest pain, palpitations and leg swelling.  Gastrointestinal: Negative for abdominal pain, constipation and heartburn.  Musculoskeletal: Negative for back pain, joint pain and myalgias.  Skin: Negative.   Neurological: Negative  for dizziness.  Psychiatric/Behavioral: The patient is not nervous/anxious.     Physical Exam  Constitutional: She appears well-developed and well-nourished. No distress.  Thin   Eyes:  Bilateral eye redness with yellow drainage present.   Neck: No thyromegaly present.  Cardiovascular: Normal rate, regular rhythm, normal heart sounds and intact distal pulses.  Pulmonary/Chest: Effort normal and breath sounds normal. No respiratory distress.  Abdominal: Soft. Bowel sounds are normal. She exhibits no distension. There is no tenderness.  Musculoskeletal: She exhibits no edema.  Is able to move all extremities  Wears ted hose   Lymphadenopathy:    She has no cervical adenopathy.  Neurological: She is alert.  Skin: Skin is warm and dry. She is not diaphoretic.  Psychiatric: She has a normal mood and affect.    ASSESSMENT/ PLAN:  TODAY:   1. COPD 2. Vascular dementia without behavioral disturbance 3. Chronic constipation 4. Weight loss  Will stop melatonin Will change lasix to 10 mg daily and will monitor her for worsening edema Will begin marinol 2.5 mg twice daily with lunch and supper for 30 days to stimulate appetite Will begin tobredex eye drops: 1 drop both eyes every 6 hours through 10-12-18.      MD is aware of resident's narcotic use and is in agreement with current plan of care. We will attempt to wean resident as apropriate   Ok Edwards NP [facility]  Contact [phone] Monday through Friday 8am- 5pm  After hours call [phone]

## 2018-10-17 ENCOUNTER — Encounter: Payer: Self-pay | Admitting: Adult Health

## 2018-10-17 ENCOUNTER — Non-Acute Institutional Stay (SKILLED_NURSING_FACILITY): Payer: Medicare Other | Admitting: Adult Health

## 2018-10-17 DIAGNOSIS — R634 Abnormal weight loss: Secondary | ICD-10-CM

## 2018-10-17 DIAGNOSIS — F0150 Vascular dementia without behavioral disturbance: Secondary | ICD-10-CM

## 2018-10-17 DIAGNOSIS — E440 Moderate protein-calorie malnutrition: Secondary | ICD-10-CM | POA: Diagnosis not present

## 2018-10-17 DIAGNOSIS — I5032 Chronic diastolic (congestive) heart failure: Secondary | ICD-10-CM | POA: Diagnosis not present

## 2018-10-17 NOTE — Progress Notes (Signed)
[facility]:   [facility] Room Number: [place] of Service:  SNF (31)   CODE STATUS: DNR  No Known Allergies  Chief Complaint  Patient presents with  . Medical Management of Chronic Issues    Chronic diastolic congestive heart failure; vascular dementia without behavioral disturbance; weight loss; protein-calorie malnutrition moderate    HPI:  She is a 84 year old long term resident of this facility being seen for the management of her chronic illnesses: chf; dementia; weight loss and malnutrition. She is presently taking marinol for 30 days to help with her weight loss. She denies any anxiety; no insomnia; no uncontrolled pain.   Past Medical History:  Diagnosis Date  . A-fib ([place])   . Cataract cortical, senile   . Chicken pox   . Colon adenoma    unspecified  . COPD (chronic obstructive pulmonary disease) ([place])   . Diverticulosis   . Glaucoma   . Hemorrhoids   . Hypertension   . Lumbar degenerative disc disease   . Mitral valve prolapse   . Osteoporosis   . Palpitations     Past Surgical History:  Procedure Laterality Date  . BREAST LUMPECTOMY    . CATARACT EXTRACTION Left   . CHOLECYSTECTOMY    . COLONOSCOPY  08/2000  . CORRECTION HAMMER TOE    . INTRAMEDULLARY (IM) NAIL INTERTROCHANTERIC Right 03/07/2018   Procedure: INTRAMEDULLARY (IM) NAIL INTERTROCHANTRIC;  Surgeon: Lovell Sheehan, MD;  Location: [facility] ORS;  Service: Orthopedics;  Laterality: Right;    Social History   Socioeconomic History  . Marital status: Widowed    Spouse name: Not on file  . Number of children: Not on file  . Years of education: Not on file  . Highest education level: Not on file  Occupational History  . Not on file  Social Needs  . Financial resource strain: Not on file  . Food insecurity:    Worry: Not on file    Inability: Not on file  . Transportation needs:    Medical: Not on file    Non-medical: Not on file  Tobacco Use  . Smoking status:  Never Smoker  . Smokeless tobacco: Never Used  Substance and Sexual Activity  . Alcohol use: Not Currently  . Drug use: Not on file  . Sexual activity: Not on file  Lifestyle  . Physical activity:    Days per week: Not on file    Minutes per session: Not on file  . Stress: Not on file  Relationships  . Social connections:    Talks on phone: Not on file    Gets together: Not on file    Attends religious service: Not on file    Active member of club or organization: Not on file    Attends meetings of clubs or organizations: Not on file    Relationship status: Not on file  . Intimate partner violence:    Fear of current or ex partner: Not on file    Emotionally abused: Not on file    Physically abused: Not on file    Forced sexual activity: Not on file  Other Topics Concern  . Not on file  Social History Narrative  . Not on file   Family History  Problem Relation Age of Onset  . Pancreatic cancer Mother   . Emphysema Sister   . Glaucoma Sister       VITAL SIGNS BP (!) 98/55   Pulse 68  Temp 98.2 F (36.8 C)   Resp 16   Ht 5\' 6"  (1.676 m)   Wt 115 lb 11.2 oz (52.5 kg)   SpO2 97%   BMI 18.67 kg/m   Outpatient Encounter Medications as of 10/17/2018  Medication Sig  . acetaminophen (TYLENOL) 325 MG tablet Take 650 mg by mouth every 4 (four) hours as needed for pain. pain/fever- do not exceed 3000mg  in 24hr period  . alum & mag hydroxide-simeth (MAALOX PLUS) 400-400-40 MG/5ML suspension Take 30 mLs by mouth every 4 (four) hours as needed.  Marland Kitchen aspirin EC 81 MG tablet Take 81 mg by mouth daily.   . bisacodyl (BISACODYL) 5 MG EC tablet Take 5 mg by mouth daily as needed for moderate constipation.  . calcium-vitamin D (OSCAL WITH D) 500-200 MG-UNIT tablet Take 1 tablet by mouth daily with breakfast.  . Cholecalciferol 1000 units tablet Take 4,000 Units by mouth daily.   . diphenhydrAMINE-zinc acetate (BENADRYL) cream Apply 1 application topically 2 (two) times daily as  needed for itching. Apply to itchy skin  . dronabinol (MARINOL) 2.5 MG capsule Take 1 capsule (2.5 mg total) by mouth 2 (two) times daily before a meal.  . feeding supplement, ENSURE ENLIVE, (ENSURE ENLIVE) LIQD Take 237 mLs by mouth 2 (two) times daily between meals.  . fluticasone (FLONASE) 50 MCG/ACT nasal spray Place 1 spray into both nostrils daily.   . Fluticasone-Salmeterol (WIXELA INHUB) 100-50 MCG/DOSE AEPB Inhale 1 puff into the lungs 2 (two) times daily.  . furosemide (LASIX) 20 MG tablet Take 1 tablet (20 mg total) by mouth daily.  . magnesium hydroxide (MILK OF MAGNESIA) 400 MG/5ML suspension Take 30 mLs by mouth every 4 (four) hours as needed for mild constipation. If no BM for 2 days  . memantine (NAMENDA) 5 MG tablet Take 5 mg by mouth daily.   . metoprolol tartrate (LOPRESSOR) 25 MG tablet Take 25 mg by mouth 2 (two) times daily.   . Multiple Vitamins-Minerals (SENIOR TABS) TABS Take 1 tablet by mouth daily.   . NON FORMULARY Diet Type: Regular with chopped meat and added gravy  . sennosides-docusate sodium (SENOKOT-S) 8.6-50 MG tablet Take 1 tablet by mouth daily.  . Skin Protectants, Misc. (ENDIT EX) Apply topically. apply thick layer to buttock due to redness three times daily as needed  . Travoprost, BAK Free, (TRAVATAN Z) 0.004 % SOLN ophthalmic solution Apply 1 drop to eye at bedtime.   . [DISCONTINUED] Melatonin 5 MG TABS Take 5 mg by mouth at bedtime as needed for sleep.   No facility-administered encounter medications on file as of 10/17/2018.      SIGNIFICANT DIAGNOSTIC EXAMS  LABS REVIEWED PREVIOUS:   04-10-18: wbc 20.2; hgb 12.8; hc6 39.4; mcv 93.;9 plt 376; glucose 105; bun 27; creat 0.72; k+ 3.8; na++  135 ca 8.2 chol 118; ldl 75; trig 72; hdl 29  04-14-18: wbc 18.1; hgb 12.4; hct 37.7; mcv 93.;4 plt 335; glucose 103; bun 23; creat 0.56; k+ 3.2; na++ 134; ca 7.8  08-29-18: glucose 101; bun 22; creat 0.73; k+ 4.2; na++ 141; ca 8.7   NO NEW LABS.    Review of  Systems  Constitutional: Negative for malaise/fatigue.  Respiratory: Negative for cough and shortness of breath.   Cardiovascular: Negative for chest pain, palpitations and leg swelling.  Gastrointestinal: Negative for abdominal pain, constipation and heartburn.  Musculoskeletal: Negative for back pain, joint pain and myalgias.  Skin: Negative.   Neurological: Negative for dizziness.  Psychiatric/Behavioral: The  patient is not nervous/anxious.     Physical Exam Constitutional:      General: She is not in acute distress.    Appearance: Normal appearance. She is well-developed. She is not diaphoretic.     Comments: Frail   Neck:     Musculoskeletal: Neck supple.     Thyroid: No thyromegaly.  Cardiovascular:     Rate and Rhythm: Normal rate and regular rhythm.     Pulses: Normal pulses.     Heart sounds: Normal heart sounds.  Pulmonary:     Effort: Pulmonary effort is normal. No respiratory distress.     Breath sounds: Normal breath sounds.  Abdominal:     General: Bowel sounds are normal. There is no distension.     Palpations: Abdomen is soft.     Tenderness: There is no abdominal tenderness.  Musculoskeletal:     Right lower leg: No edema.     Left lower leg: No edema.     Comments: Is able to move all extremities  Wears ted hose    Lymphadenopathy:     Cervical: No cervical adenopathy.  Skin:    General: Skin is warm and dry.  Neurological:     General: No focal deficit present.     Mental Status: She is alert. Mental status is at baseline.     ASSESSMENT/ PLAN:  TODAY:   1.  Vascular dementia without behavioral disturbance: is without change weight is 115 (previous 117) pounds; will continue namenda 5 mg daily   2. Weight loss/protein calorie malnutrition, moderate: is stable; will continue marinol 2.5 mg twice daily for one month; will continue supplements as directed.   3. CHF, chronic diastolic: is stable will continue lasix 20 mg daily lopressor 25 mg twice  daily  PREVIOUS   4. Hypertensive heart disease with congestive heart failure: is stable b/p 98/58: will continue lopressor 25 mg twice daily asa 81 mg daily   5. Chronic atrial fibrillation: heart reate stable will continue asa 81 mg daily  6.  COPD: is stable will continue wixela inhub 100/50 twice daily and flonase nightly   7.  Chronic constipation: is stable will continue senna s daily   8. Bilateral chronic glaucoma: is stable will continue travatan to eyes nightly    Will check cbc; cmp vit D      MD is aware of resident's narcotic use and is in agreement with current plan of care. We will attempt to wean resident as apropriate   Ok Edwards NP [facility]  Contact [phone] Monday through Friday 8am- 5pm  After hours call [phone]

## 2018-10-18 ENCOUNTER — Other Ambulatory Visit
Admission: RE | Admit: 2018-10-18 | Discharge: 2018-10-18 | Disposition: A | Discharge status: Home / Self Care | Payer: Medicare Other | Source: Skilled Nursing Facility | Attending: Adult Health | Admitting: Adult Health

## 2018-10-18 DIAGNOSIS — I110 Hypertensive heart disease with heart failure: Secondary | ICD-10-CM | POA: Diagnosis present

## 2018-10-18 DIAGNOSIS — E440 Moderate protein-calorie malnutrition: Secondary | ICD-10-CM | POA: Insufficient documentation

## 2018-10-18 DIAGNOSIS — I509 Heart failure, unspecified: Secondary | ICD-10-CM | POA: Insufficient documentation

## 2018-10-18 LAB — COMPREHENSIVE METABOLIC PANEL
ALBUMIN: 3 g/dL — AB (ref 3.5–5.0)
ALT: 19 U/L (ref 0–44)
AST: 25 U/L (ref 15–41)
Alkaline Phosphatase: 93 U/L (ref 38–126)
Anion gap: 6 (ref 5–15)
BILIRUBIN TOTAL: 1.5 mg/dL — AB (ref 0.3–1.2)
BUN: 25 mg/dL — AB (ref 8–23)
CALCIUM: 8.5 mg/dL — AB (ref 8.9–10.3)
CO2: 28 mmol/L (ref 22–32)
Chloride: 103 mmol/L (ref 98–111)
Creatinine, Ser: 0.72 mg/dL (ref 0.44–1.00)
GFR calc Af Amer: >60 mL/min (ref >60)
GFR calc non Af Amer: >60 mL/min (ref >60)
GLUCOSE: 112 mg/dL — AB (ref 70–99)
Potassium: 4.3 mmol/L (ref 3.5–5.1)
Sodium: 137 mmol/L (ref 135–145)
TOTAL PROTEIN: 6 g/dL — AB (ref 6.5–8.1)

## 2018-10-18 LAB — CBC WITH DIFFERENTIAL/PLATELET
Abs Immature Granulocytes: 0.08 10*3/uL — ABNORMAL HIGH (ref 0.00–0.07)
BASOS ABS: 0.1 10*3/uL (ref 0.0–0.1)
Basophils Relative: 1 %
EOS PCT: 4 %
Eosinophils Absolute: 0.6 10*3/uL — ABNORMAL HIGH (ref 0.0–0.5)
HEMATOCRIT: 45.5 % (ref 36.0–46.0)
Hemoglobin: 14.2 g/dL (ref 12.0–15.0)
IMMATURE GRANULOCYTES: 1 %
LYMPHS ABS: 2.8 10*3/uL (ref 0.7–4.0)
LYMPHS PCT: 18 %
MCH: 29.9 pg (ref 26.0–34.0)
MCHC: 31.2 g/dL (ref 30.0–36.0)
MCV: 95.8 fL (ref 80.0–100.0)
MONOS PCT: 17 %
Monocytes Absolute: 2.6 10*3/uL — ABNORMAL HIGH (ref 0.1–1.0)
NRBC: 0 % (ref 0.0–0.2)
Neutro Abs: 9.2 10*3/uL — ABNORMAL HIGH (ref 1.7–7.7)
Neutrophils Relative %: 59 %
Platelets: 295 10*3/uL (ref 150–400)
RBC: 4.75 MIL/uL (ref 3.87–5.11)
RDW: 13.8 % (ref 11.5–15.5)
WBC: 15.3 10*3/uL — ABNORMAL HIGH (ref 4.0–10.5)

## 2018-10-19 LAB — VITAMIN D 25 HYDROXY (VIT D DEFICIENCY, FRACTURES): Vit D, 25-Hydroxy: 51.3 ng/mL (ref 30.0–100.0)

## 2018-11-01 ENCOUNTER — Encounter
Admission: RE | Admit: 2018-11-01 | Discharge: 2018-11-01 | Disposition: A | Discharge status: Home / Self Care | Payer: Medicare Other | Source: Ambulatory Visit | Attending: Internal Medicine | Admitting: Internal Medicine

## 2018-11-01 DIAGNOSIS — I509 Heart failure, unspecified: Secondary | ICD-10-CM | POA: Insufficient documentation

## 2018-11-01 DIAGNOSIS — I110 Hypertensive heart disease with heart failure: Secondary | ICD-10-CM | POA: Insufficient documentation

## 2018-11-23 ENCOUNTER — Non-Acute Institutional Stay (SKILLED_NURSING_FACILITY): Payer: Medicare Other | Admitting: Adult Health

## 2018-11-23 ENCOUNTER — Encounter: Payer: Self-pay | Admitting: Adult Health

## 2018-11-23 DIAGNOSIS — K5909 Other constipation: Secondary | ICD-10-CM | POA: Diagnosis not present

## 2018-11-23 DIAGNOSIS — I482 Chronic atrial fibrillation, unspecified: Secondary | ICD-10-CM

## 2018-11-23 DIAGNOSIS — J449 Chronic obstructive pulmonary disease, unspecified: Secondary | ICD-10-CM

## 2018-11-23 DIAGNOSIS — I130 Hypertensive heart and chronic kidney disease with heart failure and stage 1 through stage 4 chronic kidney disease, or unspecified chronic kidney disease: Secondary | ICD-10-CM | POA: Diagnosis not present

## 2018-11-23 NOTE — Progress Notes (Signed)
Location:   [place] Number: 3 Place of Service:  SNF (31)   CODE STATUS: dnr  No Known Allergies  Chief Complaint  Patient presents with  . Medical Management of Chronic Issues    Hypertensive heart and renal disease with (congestive) heart failure; chronic obstructive pulmonary disaese unspecified COPD type; chronic constipation; chronic atrial fibrillation.     HPI:  She is a 85 year old woman being seen for the management of her chronic illnesses: hypertensive heart disease; copd; constipation; afib. She denies any cough or shortness of breath; or constipation.   Past Medical History:  Diagnosis Date  . A-fib ([place])   . Cataract cortical, senile   . Chicken pox   . Colon adenoma    unspecified  . COPD (chronic obstructive pulmonary disease) ([place])   . Diverticulosis   . Glaucoma   . Hemorrhoids   . Hypertension   . Lumbar degenerative disc disease   . Mitral valve prolapse   . Osteoporosis   . Palpitations     Past Surgical History:  Procedure Laterality Date  . BREAST LUMPECTOMY    . CATARACT EXTRACTION Left   . CHOLECYSTECTOMY    . COLONOSCOPY  08/2000  . CORRECTION HAMMER TOE    . INTRAMEDULLARY (IM) NAIL INTERTROCHANTERIC Right 03/07/2018   Procedure: INTRAMEDULLARY (IM) NAIL INTERTROCHANTRIC;  Surgeon: Lovell Sheehan, MD;  Location: [facility] ORS;  Service: Orthopedics;  Laterality: Right;    Social History   Socioeconomic History  . Marital status: Widowed    Spouse name: Not on file  . Number of children: Not on file  . Years of education: Not on file  . Highest education level: Not on file  Occupational History  . Not on file  Social Needs  . Financial resource strain: Not on file  . Food insecurity:    Worry: Not on file    Inability: Not on file  . Transportation needs:    Medical: Not on file    Non-medical: Not on file  Tobacco Use  . Smoking status: Never Smoker  . Smokeless tobacco: Never Used  Substance and Sexual  Activity  . Alcohol use: Not Currently  . Drug use: Not on file  . Sexual activity: Not on file  Lifestyle  . Physical activity:    Days per week: Not on file    Minutes per session: Not on file  . Stress: Not on file  Relationships  . Social connections:    Talks on phone: Not on file    Gets together: Not on file    Attends religious service: Not on file    Active member of club or organization: Not on file    Attends meetings of clubs or organizations: Not on file    Relationship status: Not on file  . Intimate partner violence:    Fear of current or ex partner: Not on file    Emotionally abused: Not on file    Physically abused: Not on file    Forced sexual activity: Not on file  Other Topics Concern  . Not on file  Social History Narrative  . Not on file   Family History  Problem Relation Age of Onset  . Pancreatic cancer Mother   . Emphysema Sister   . Glaucoma Sister       VITAL SIGNS BP (!) 130/55   Pulse 60   Temp 98.4 F (36.9 C)   Resp 18   Ht 5'  5" (1.651 m)   Wt 117 lb 12.8 oz (53.4 kg)   SpO2 98%   BMI 19.60 kg/m   Outpatient Encounter Medications as of 11/23/2018  Medication Sig  . acetaminophen (TYLENOL) 325 MG tablet Take 650 mg by mouth every 4 (four) hours as needed for pain. pain/fever- do not exceed 3000mg  in 24hr period  . alum & mag hydroxide-simeth (MAALOX PLUS) 400-400-40 MG/5ML suspension Take 30 mLs by mouth every 4 (four) hours as needed.  Marland Kitchen aspirin EC 81 MG tablet Take 81 mg by mouth daily.   . bisacodyl (BISACODYL) 5 MG EC tablet Take 5 mg by mouth daily as needed for moderate constipation.  . calcium-vitamin D (OSCAL WITH D) 500-200 MG-UNIT tablet Take 1 tablet by mouth daily with breakfast.  . Cholecalciferol 1000 units tablet Take 4,000 Units by mouth daily.   . diphenhydrAMINE-zinc acetate (BENADRYL) cream Apply 1 application topically 2 (two) times daily as needed for itching. Apply to itchy skin  . feeding supplement, ENSURE  ENLIVE, (ENSURE ENLIVE) LIQD Take 237 mLs by mouth 2 (two) times daily between meals.  . fluticasone (FLONASE) 50 MCG/ACT nasal spray Place 1 spray into both nostrils daily.   . Fluticasone-Salmeterol (WIXELA INHUB) 100-50 MCG/DOSE AEPB Inhale 1 puff into the lungs 2 (two) times daily.  . furosemide (LASIX) 20 MG tablet Take 1 tablet (20 mg total) by mouth daily.  . magnesium hydroxide (MILK OF MAGNESIA) 400 MG/5ML suspension Take 30 mLs by mouth every 4 (four) hours as needed for mild constipation. If no BM for 2 days  . metoprolol tartrate (LOPRESSOR) 25 MG tablet Take 25 mg by mouth 2 (two) times daily.   . Multiple Vitamins-Minerals (SENIOR TABS) TABS Take 1 tablet by mouth daily.   . NON FORMULARY Diet Type: Regular with chopped meat and added gravy  . sennosides-docusate sodium (SENOKOT-S) 8.6-50 MG tablet Take 1 tablet by mouth daily.  . Skin Protectants, Misc. (ENDIT EX) Apply topically. apply thick layer to buttock due to redness three times daily as needed  . Travoprost, BAK Free, (TRAVATAN Z) 0.004 % SOLN ophthalmic solution Apply 1 drop to eye at bedtime.   . memantine (NAMENDA) 5 MG tablet Take 5 mg by mouth daily.   . [DISCONTINUED] dronabinol (MARINOL) 2.5 MG capsule Take 1 capsule (2.5 mg total) by mouth 2 (two) times daily before a meal.   No facility-administered encounter medications on file as of 11/23/2018.      SIGNIFICANT DIAGNOSTIC EXAMS    LABS REVIEWED PREVIOUS:   04-10-18: wbc 20.2; hgb 12.8; hc6 39.4; mcv 93.;9 plt 376; glucose 105; bun 27; creat 0.72; k+ 3.8; na++  135 ca 8.2 chol 118; ldl 75; trig 72; hdl 29  04-14-18: wbc 18.1; hgb 12.4; hct 37.7; mcv 93.;4 plt 335; glucose 103; bun 23; creat 0.56; k+ 3.2; na++ 134; ca 7.8  08-29-18: glucose 101; bun 22; creat 0.73; k+ 4.2; na++ 141; ca 8.7   TODAY:   10-28-18: wbc 15.3l hgb 14.2; hct 45.5; mcv 95.8 plt 295; glucose 112; bun 25; creat 0.72; k+ 4.3; na++ 137; ca 8.3; total bili 1.5; albumin 3.0   Review of  Systems  Constitutional: Negative for malaise/fatigue.  Respiratory: Negative for cough and shortness of breath.   Cardiovascular: Negative for chest pain, palpitations and leg swelling.  Gastrointestinal: Negative for abdominal pain, constipation and heartburn.  Musculoskeletal: Negative for back pain, joint pain and myalgias.  Skin: Negative.   Neurological: Negative for dizziness.  Psychiatric/Behavioral: The patient  is not nervous/anxious.     Physical Exam Constitutional:      General: She is not in acute distress.    Appearance: She is well-developed. She is not diaphoretic.     Comments: Frail   Neck:     Musculoskeletal: Neck supple.     Thyroid: No thyromegaly.  Cardiovascular:     Rate and Rhythm: Normal rate and regular rhythm.     Pulses: Normal pulses.     Heart sounds: Normal heart sounds.  Pulmonary:     Effort: Pulmonary effort is normal. No respiratory distress.     Breath sounds: Normal breath sounds.  Abdominal:     General: Bowel sounds are normal. There is no distension.     Palpations: Abdomen is soft.     Tenderness: There is no abdominal tenderness.  Musculoskeletal:     Right lower leg: No edema.     Left lower leg: No edema.     Comments: Is able to move all extremities  Wears ted hose   Lymphadenopathy:     Cervical: No cervical adenopathy.  Skin:    General: Skin is warm and dry.  Neurological:     Mental Status: She is alert. Mental status is at baseline.  Psychiatric:        Mood and Affect: Mood normal.      ASSESSMENT/ PLAN:  TODAY:   1. Hypertensive heart disease with congestive heart failure: is stable b/p 130/55: will continue lopressor 25 mg twice daily asa 81 mg daily   2. Chronic atrial fibrillation: heart reate stable will continue asa 81 mg daily  3.  COPD: is stable will continue wixela inhub 100/50 twice daily and flonase nightly   4.  Chronic constipation: is stable will continue senna s daily   PREVIOUS   5.  Bilateral chronic glaucoma: is stable will continue travatan to eyes nightly   6.  Vascular dementia without behavioral disturbance: is without change weight is 117 (previous 115) pounds; will continue namenda 5 mg daily   7. Weight loss/protein calorie malnutrition, moderate: is without change has completed marinol with little effect on weight.   8. CHF, chronic diastolic: is stable will continue lasix 20 mg daily lopressor 25 mg twice daily    MD is aware of resident's narcotic use and is in agreement with current plan of care. We will attempt to wean resident as apropriate   Ok Edwards NP [facility]  Contact [phone] Monday through Friday 8am- 5pm  After hours call [phone]

## 2018-12-02 ENCOUNTER — Encounter
Admission: RE | Admit: 2018-12-02 | Discharge: 2018-12-02 | Disposition: A | Discharge status: Home / Self Care | Payer: Medicare Other | Source: Ambulatory Visit | Attending: Internal Medicine | Admitting: Internal Medicine

## 2018-12-02 DIAGNOSIS — I110 Hypertensive heart disease with heart failure: Secondary | ICD-10-CM | POA: Insufficient documentation

## 2018-12-02 DIAGNOSIS — I509 Heart failure, unspecified: Secondary | ICD-10-CM | POA: Insufficient documentation

## 2018-12-06 ENCOUNTER — Non-Acute Institutional Stay (SKILLED_NURSING_FACILITY): Payer: Medicare Other | Admitting: Adult Health

## 2018-12-06 ENCOUNTER — Encounter: Payer: Self-pay | Admitting: Adult Health

## 2018-12-06 DIAGNOSIS — F0150 Vascular dementia without behavioral disturbance: Secondary | ICD-10-CM

## 2018-12-06 DIAGNOSIS — J449 Chronic obstructive pulmonary disease, unspecified: Secondary | ICD-10-CM

## 2018-12-06 DIAGNOSIS — I130 Hypertensive heart and chronic kidney disease with heart failure and stage 1 through stage 4 chronic kidney disease, or unspecified chronic kidney disease: Secondary | ICD-10-CM | POA: Diagnosis not present

## 2018-12-06 DIAGNOSIS — E440 Moderate protein-calorie malnutrition: Secondary | ICD-10-CM | POA: Diagnosis not present

## 2018-12-06 NOTE — Progress Notes (Signed)
Location[place]   [place] [facility] Room Number: [place] of Service:  SNF (31)    CODE STATUS: DNR  No Known Allergies  Chief Complaint  Patient presents with  . Discharge Note    Discharging to [facility] on 12/07/2018    HPI:  She is being discharged to [facility]. The receiving facility will provide medications; therapy and dme. She is being transferred as this facility will be closing over the next several months.    Past Medical History:  Diagnosis Date  . A-fib ([place])   . Cataract cortical, senile   . Chicken pox   . Chronic atrial fibrillation 07/29/2018  . Colon adenoma    unspecified  . COPD (chronic obstructive pulmonary disease) ([place])   . Diverticulosis   . Glaucoma   . Hemorrhoids   . Hypertension   . Lumbar degenerative disc disease   . Mitral valve prolapse   . Osteoporosis   . Palpitations     Past Surgical History:  Procedure Laterality Date  . BREAST LUMPECTOMY    . CATARACT EXTRACTION Left   . CHOLECYSTECTOMY    . COLONOSCOPY  08/2000  . CORRECTION HAMMER TOE    . INTRAMEDULLARY (IM) NAIL INTERTROCHANTERIC Right 03/07/2018   Procedure: INTRAMEDULLARY (IM) NAIL INTERTROCHANTRIC;  Surgeon: Lovell Sheehan, MD;  Location: [facility] ORS;  Service: Orthopedics;  Laterality: Right;    Social History   Socioeconomic History  . Marital status: Widowed    Spouse name: Not on file  . Number of children: Not on file  . Years of education: Not on file  . Highest education level: Not on file  Occupational History  . Not on file  Social Needs  . Financial resource strain: Not on file  . Food insecurity:    Worry: Not on file    Inability: Not on file  . Transportation needs:    Medical: Not on file    Non-medical: Not on file  Tobacco Use  . Smoking status: Never Smoker  . Smokeless tobacco: Never Used  Substance and Sexual Activity  . Alcohol use: Not Currently  . Drug use: Not on file  . Sexual activity: Not on file    Lifestyle  . Physical activity:    Days per week: Not on file    Minutes per session: Not on file  . Stress: Not on file  Relationships  . Social connections:    Talks on phone: Not on file    Gets together: Not on file    Attends religious service: Not on file    Active member of club or organization: Not on file    Attends meetings of clubs or organizations: Not on file    Relationship status: Not on file  . Intimate partner violence:    Fear of current or ex partner: Not on file    Emotionally abused: Not on file    Physically abused: Not on file    Forced sexual activity: Not on file  Other Topics Concern  . Not on file  Social History Narrative  . Not on file   Family History  Problem Relation Age of Onset  . Pancreatic cancer Mother   . Emphysema Sister   . Glaucoma Sister     VITAL SIGNS BP (!) 149/65   Pulse 88   Temp 97.7 F (36.5 C)   Resp 20   Ht 5\' 6"  (1.676 m)   Wt 118 lb 9.6 oz (  53.8 kg)   SpO2 97%   BMI 19.14 kg/m   Patient's Medications  New Prescriptions   No medications on file  Previous Medications   ACETAMINOPHEN (TYLENOL) 325 MG TABLET    Take 650 mg by mouth every 4 (four) hours as needed for pain. pain/fever- do not exceed 3000mg  in 24hr period   ALUM & MAG HYDROXIDE-SIMETH (MAALOX PLUS) 400-400-40 MG/5ML SUSPENSION    Take 30 mLs by mouth every 4 (four) hours as needed.   ASPIRIN EC 81 MG TABLET    Take 81 mg by mouth daily.    BISACODYL (BISACODYL) 5 MG EC TABLET    Take 5 mg by mouth daily as needed for moderate constipation.   CALCIUM-VITAMIN D (OSCAL WITH D) 500-200 MG-UNIT TABLET    Take 1 tablet by mouth daily with breakfast.   CHOLECALCIFEROL 1000 UNITS TABLET    Take 4,000 Units by mouth daily.    DIPHENHYDRAMINE-ZINC ACETATE (BENADRYL) CREAM    Apply 1 application topically 2 (two) times daily as needed for itching. Apply to itchy skin   FEEDING SUPPLEMENT, ENSURE ENLIVE, (ENSURE ENLIVE) LIQD    Take 237 mLs by mouth 2 (two)  times daily between meals.   FLUTICASONE (FLONASE) 50 MCG/ACT NASAL SPRAY    Place 1 spray into both nostrils daily.    FLUTICASONE-SALMETEROL (WIXELA INHUB) 100-50 MCG/DOSE AEPB    Inhale 1 puff into the lungs 2 (two) times daily.   FUROSEMIDE (LASIX) 20 MG TABLET    Take 1 tablet (20 mg total) by mouth daily.   MAGNESIUM HYDROXIDE (MILK OF MAGNESIA) 400 MG/5ML SUSPENSION    Take 30 mLs by mouth every 4 (four) hours as needed for mild constipation. If no BM for 2 days   MEMANTINE (NAMENDA) 5 MG TABLET    Take 5 mg by mouth daily.    METOPROLOL TARTRATE (LOPRESSOR) 25 MG TABLET    Take 25 mg by mouth 2 (two) times daily.    MULTIPLE VITAMINS-MINERALS (SENIOR TABS) TABS    Take 1 tablet by mouth daily.    NON FORMULARY    Diet Type: Regular with chopped meat and added gravy   SENNOSIDES-DOCUSATE SODIUM (SENOKOT-S) 8.6-50 MG TABLET    Take 1 tablet by mouth daily.   SKIN PROTECTANTS, MISC. (ENDIT EX)    Apply topically. apply thick layer to buttock due to redness three times daily as needed   TRAVOPROST, BAK FREE, (TRAVATAN Z) 0.004 % SOLN OPHTHALMIC SOLUTION    Apply 1 drop to eye at bedtime.   Modified Medications   No medications on file  Discontinued Medications   No medications on file     SIGNIFICANT DIAGNOSTIC EXAMS  LABS REVIEWED PREVIOUS:   04-10-18: wbc 20.2; hgb 12.8; hc6 39.4; mcv 93.;9 plt 376; glucose 105; bun 27; creat 0.72; k+ 3.8; na++  135 ca 8.2 chol 118; ldl 75; trig 72; hdl 29  04-14-18: wbc 18.1; hgb 12.4; hct 37.7; mcv 93.;4 plt 335; glucose 103; bun 23; creat 0.56; k+ 3.2; na++ 134; ca 7.8  08-29-18: glucose 101; bun 22; creat 0.73; k+ 4.2; na++ 141; ca 8.7  10-28-18: wbc 15.3l hgb 14.2; hct 45.5; mcv 95.8 plt 295; glucose 112; bun 25; creat 0.72; k+ 4.3; na++ 137; ca 8.3; total bili 1.5; albumin 3.0   NO NEW LABS.   Review of Systems  Constitutional: Negative for malaise/fatigue.  Respiratory: Negative for cough and shortness of breath.   Cardiovascular: Negative  for chest pain, palpitations and  leg swelling.  Gastrointestinal: Negative for abdominal pain, constipation and heartburn.  Musculoskeletal: Negative for back pain, joint pain and myalgias.  Skin: Negative.   Neurological: Negative for dizziness.  Psychiatric/Behavioral: The patient is not nervous/anxious.     Physical Exam Constitutional:      General: She is not in acute distress.    Appearance: She is well-developed. She is not diaphoretic.     Comments: Frail   Neck:     Thyroid: No thyromegaly.  Cardiovascular:     Rate and Rhythm: Normal rate and regular rhythm.     Pulses: Normal pulses.     Heart sounds: Normal heart sounds.  Pulmonary:     Effort: Pulmonary effort is normal. No respiratory distress.     Breath sounds: Normal breath sounds.  Abdominal:     General: Bowel sounds are normal. There is no distension.     Palpations: Abdomen is soft.     Tenderness: There is no abdominal tenderness.  Musculoskeletal:     Right lower leg: No edema.     Left lower leg: No edema.     Comments: Able to move all extremities   Lymphadenopathy:     Cervical: No cervical adenopathy.  Skin:    General: Skin is warm and dry.  Neurological:     Mental Status: She is alert. Mental status is at baseline.  Psychiatric:        Mood and Affect: Mood normal.       ASSESSMENT/ PLAN:   Patient is being discharged with the following home health services:  None needed   Patient is being discharged with the following durable medical equipment:  None needed   Patient has been advised to f/u with their PCP in 1-2 weeks to bring them up to date on their rehab stay.  Social services at facility was responsible for arranging this appointment.  Pt was provided with a 30 day supply of prescriptions for medications and refills must be obtained from their PCP.  For controlled substances, a more limited supply may be provided adequate until PCP appointment only.  Receiving facility for provide  all medications Will follow up medically with the provider at the receiving facility    Ok Edwards NP [facility]  Contact [phone] Monday through Friday 8am- 5pm  After hours call [phone]

## 2018-12-12 DIAGNOSIS — F0150 Vascular dementia without behavioral disturbance: Secondary | ICD-10-CM

## 2018-12-12 DIAGNOSIS — I5032 Chronic diastolic (congestive) heart failure: Secondary | ICD-10-CM

## 2018-12-12 DIAGNOSIS — J439 Emphysema, unspecified: Secondary | ICD-10-CM

## 2018-12-12 DIAGNOSIS — E441 Mild protein-calorie malnutrition: Secondary | ICD-10-CM

## 2018-12-12 DIAGNOSIS — I480 Paroxysmal atrial fibrillation: Secondary | ICD-10-CM

## 2019-01-12 DIAGNOSIS — R05 Cough: Secondary | ICD-10-CM | POA: Diagnosis not present

## 2019-01-15 DIAGNOSIS — F0150 Vascular dementia without behavioral disturbance: Secondary | ICD-10-CM

## 2019-01-15 DIAGNOSIS — E441 Mild protein-calorie malnutrition: Secondary | ICD-10-CM

## 2019-01-15 DIAGNOSIS — I5032 Chronic diastolic (congestive) heart failure: Secondary | ICD-10-CM

## 2019-01-15 DIAGNOSIS — J439 Emphysema, unspecified: Secondary | ICD-10-CM | POA: Diagnosis not present

## 2019-01-15 DIAGNOSIS — I480 Paroxysmal atrial fibrillation: Secondary | ICD-10-CM

## 2019-02-08 DIAGNOSIS — E441 Mild protein-calorie malnutrition: Secondary | ICD-10-CM | POA: Diagnosis not present

## 2019-02-14 DIAGNOSIS — I5030 Unspecified diastolic (congestive) heart failure: Secondary | ICD-10-CM

## 2019-02-14 DIAGNOSIS — E43 Unspecified severe protein-calorie malnutrition: Secondary | ICD-10-CM

## 2019-02-14 DIAGNOSIS — F0150 Vascular dementia without behavioral disturbance: Secondary | ICD-10-CM

## 2019-02-14 DIAGNOSIS — J449 Chronic obstructive pulmonary disease, unspecified: Secondary | ICD-10-CM

## 2019-02-14 DIAGNOSIS — I4891 Unspecified atrial fibrillation: Secondary | ICD-10-CM

## 2019-03-12 DIAGNOSIS — J439 Emphysema, unspecified: Secondary | ICD-10-CM

## 2019-03-12 DIAGNOSIS — E440 Moderate protein-calorie malnutrition: Secondary | ICD-10-CM

## 2019-03-12 DIAGNOSIS — I5032 Chronic diastolic (congestive) heart failure: Secondary | ICD-10-CM | POA: Diagnosis not present

## 2019-03-12 DIAGNOSIS — I480 Paroxysmal atrial fibrillation: Secondary | ICD-10-CM

## 2019-03-12 DIAGNOSIS — F0150 Vascular dementia without behavioral disturbance: Secondary | ICD-10-CM | POA: Diagnosis not present

## 2019-03-27 DIAGNOSIS — I87319 Chronic venous hypertension (idiopathic) with ulcer of unspecified lower extremity: Secondary | ICD-10-CM

## 2019-04-09 DIAGNOSIS — I749 Embolism and thrombosis of unspecified artery: Secondary | ICD-10-CM | POA: Diagnosis not present

## 2019-04-09 DIAGNOSIS — L97301 Non-pressure chronic ulcer of unspecified ankle limited to breakdown of skin: Secondary | ICD-10-CM

## 2019-05-01 DIAGNOSIS — L97319 Non-pressure chronic ulcer of right ankle with unspecified severity: Secondary | ICD-10-CM

## 2019-05-16 DIAGNOSIS — E43 Unspecified severe protein-calorie malnutrition: Secondary | ICD-10-CM | POA: Diagnosis not present

## 2019-05-16 DIAGNOSIS — I4891 Unspecified atrial fibrillation: Secondary | ICD-10-CM | POA: Diagnosis not present

## 2019-05-16 DIAGNOSIS — J449 Chronic obstructive pulmonary disease, unspecified: Secondary | ICD-10-CM | POA: Diagnosis not present

## 2019-05-16 DIAGNOSIS — I5030 Unspecified diastolic (congestive) heart failure: Secondary | ICD-10-CM | POA: Diagnosis not present

## 2019-05-16 DIAGNOSIS — F0150 Vascular dementia without behavioral disturbance: Secondary | ICD-10-CM

## 2019-07-03 DEATH — deceased

## 2019-12-01 IMAGING — CR DG CHEST 1V PORT
1 series · 1 of 1 positions shown · non-contrast
Comparison: Chest radiograph dated 09/25/2014

CLINICAL DATA: [AGE] female with right hip fracture. Preop
radiograph.

EXAM:
PORTABLE CHEST 1 VIEW

[chest ap]
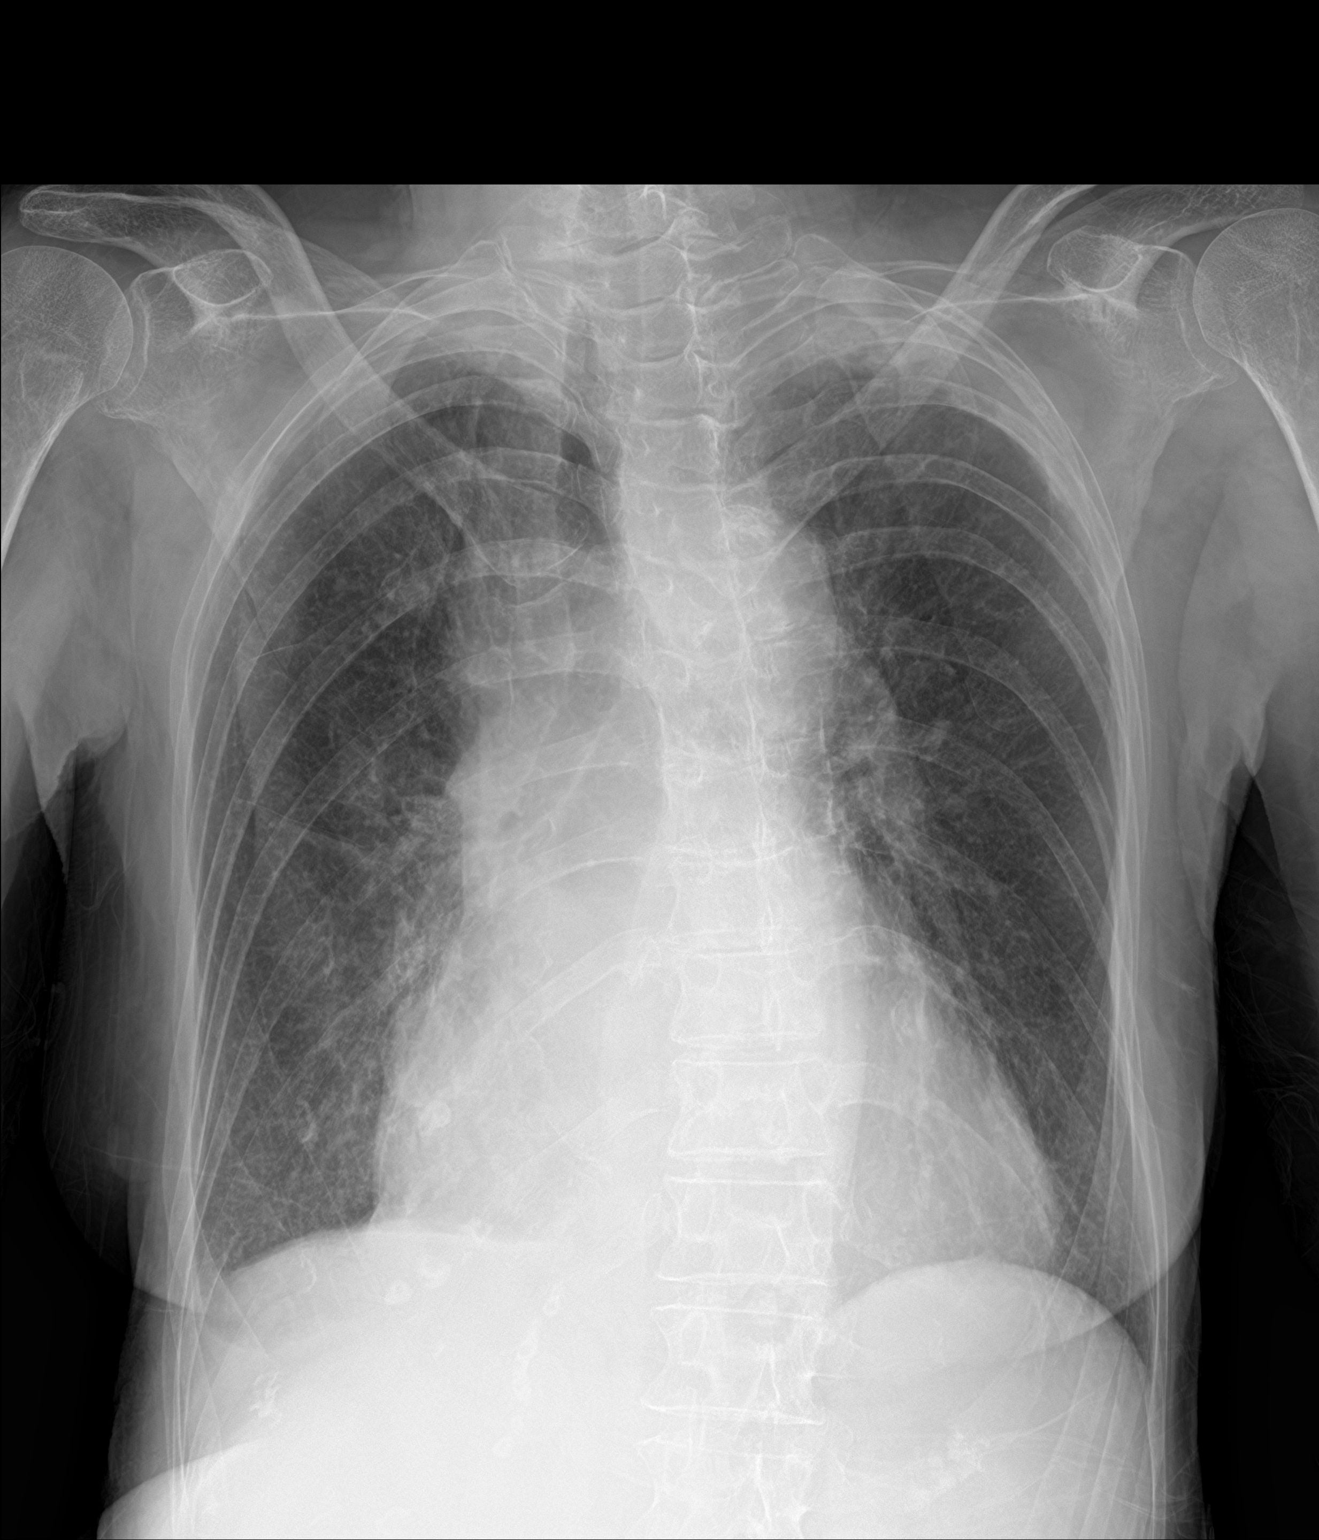

[1 of 1 positions shown; findings below may reference images not displayed]

FINDINGS: There is cardiomegaly with increase in the size of the
cardiopericardial silhouette since the prior radiograph. No vascular
congestion or edema. There is no focal consolidation, pleural
effusion, or pneumothorax. No acute osseous pathology. Osteopenia.
Atherosclerotic calcification of the aorta.
IMPRESSION: 1. No acute cardiopulmonary process.
2. Cardiomegaly.

## 2019-12-01 IMAGING — CT CT HEAD W/O CM
3 series · 15 of 46 positions shown, 18 images · non-contrast
Comparison: None.

CLINICAL DATA: [AGE]/o  F; fall with head injury.

EXAM:
CT HEAD WITHOUT CONTRAST
TECHNIQUE: Contiguous axial images were obtained from the base of the skull
through the vertex without intravenous contrast.

[Series 3: head wo · axial · 0.41mm/px · z∈[-128,-8]mm · 9 of 29 slices shown, 12 images]
[im 3/29  brain]
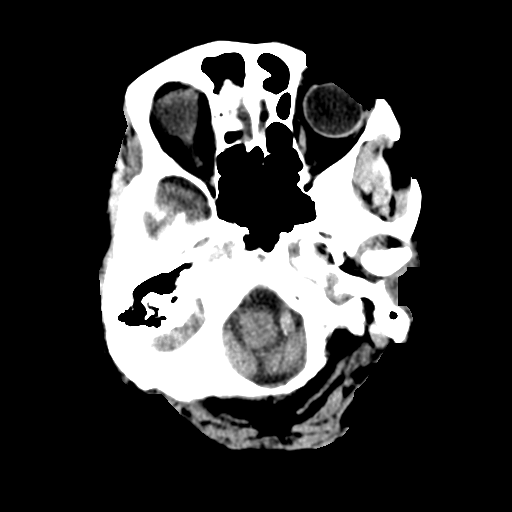
[im 3/29  bone]
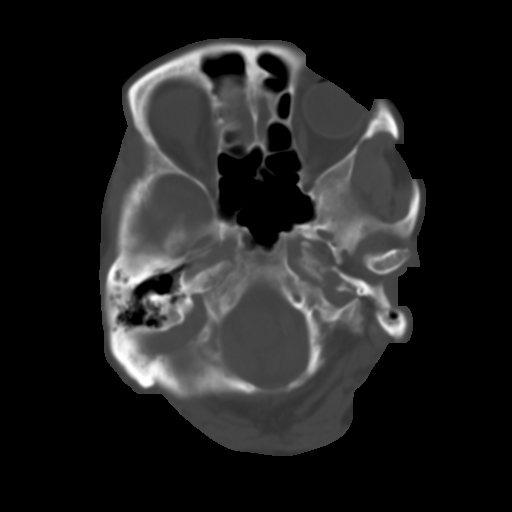
[im 6/29  brain]
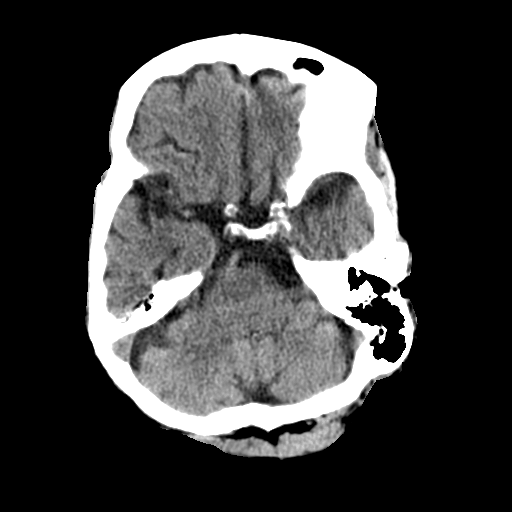
[im 9/29  brain]
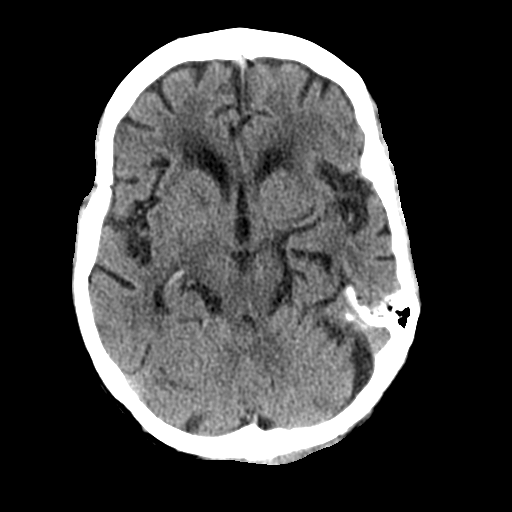
[im 12/29  brain]
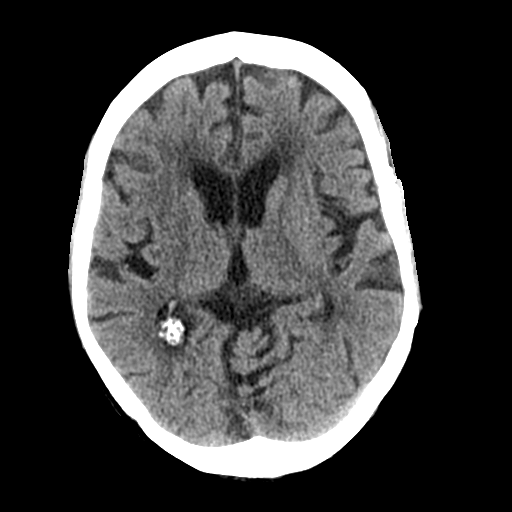
[im 15/29  brain]
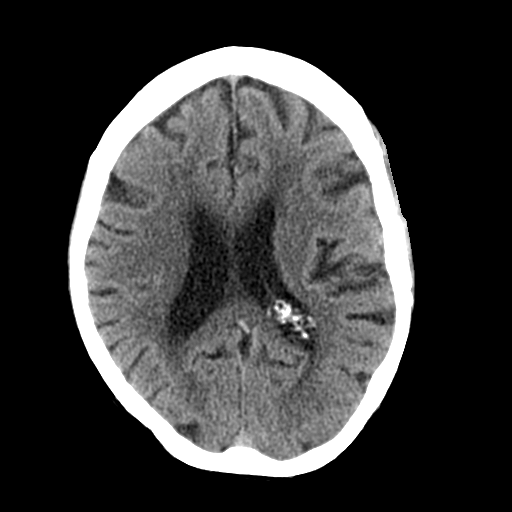
[im 15/29  bone]
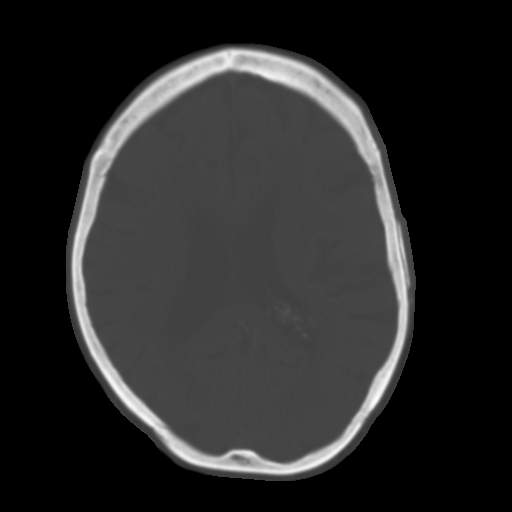
[im 18/29  brain]
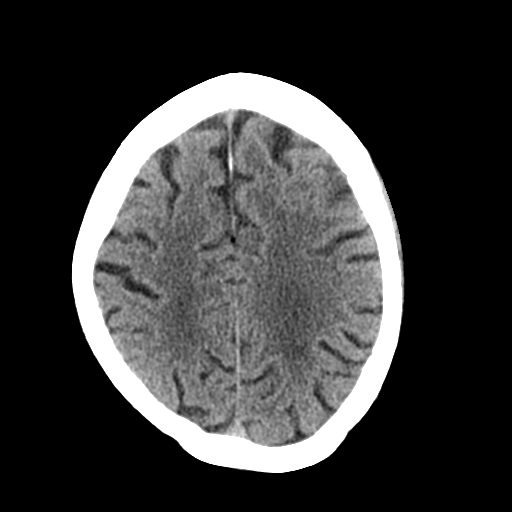
[im 21/29  brain]
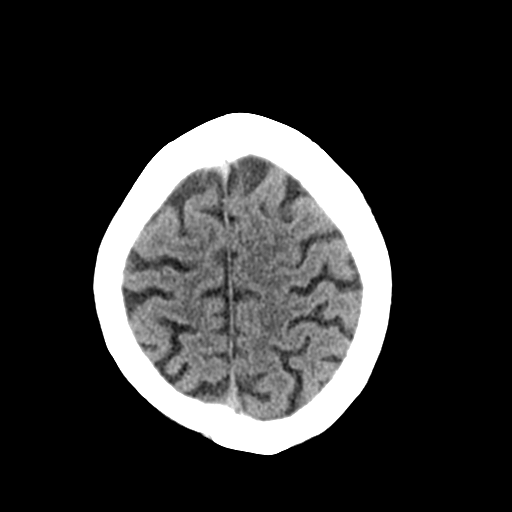
[im 24/29  brain]
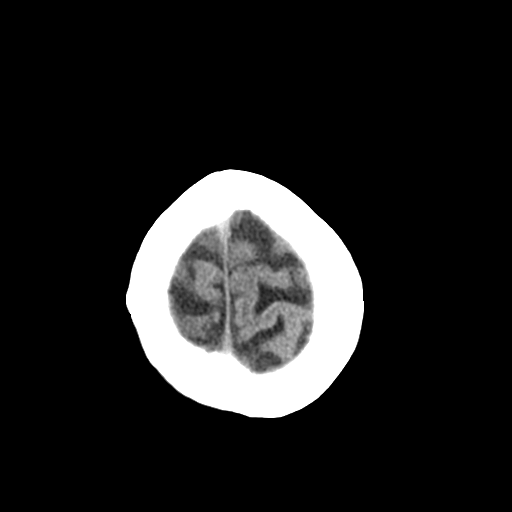
[im 27/29  brain]
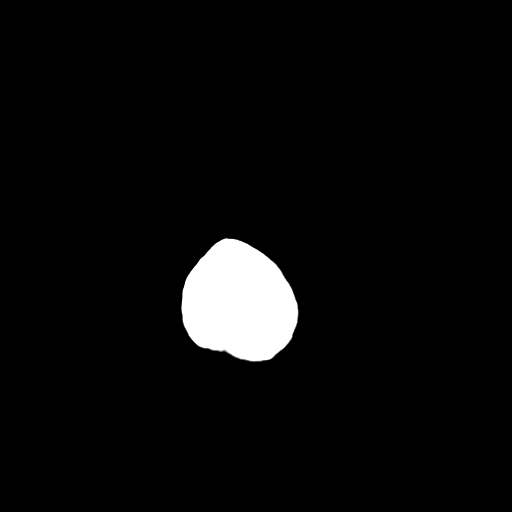
[im 27/29  bone]
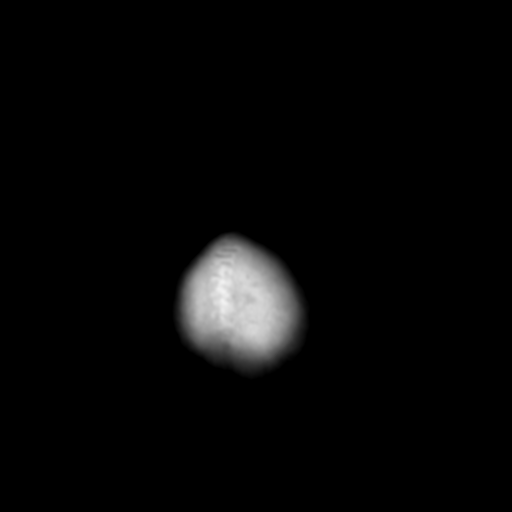

[Series 4: coronal soft tissue · coronal · 0.28mm/px · 3 of 63 slices shown]
[im 21/63  brain]
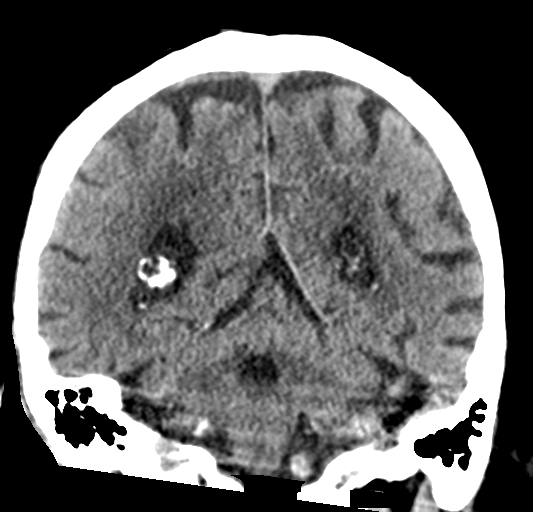
[im 28/63  brain]
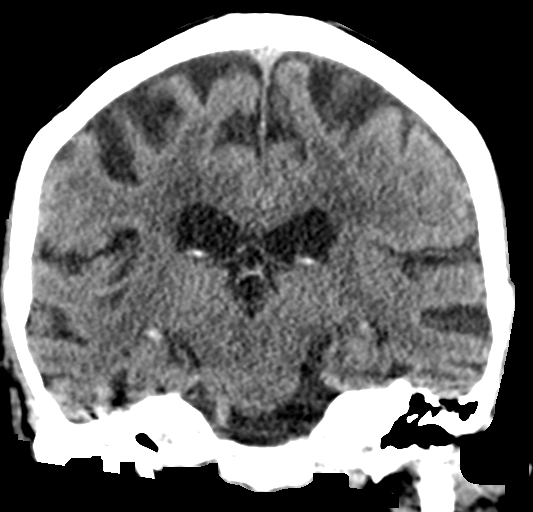
[im 35/63  brain]
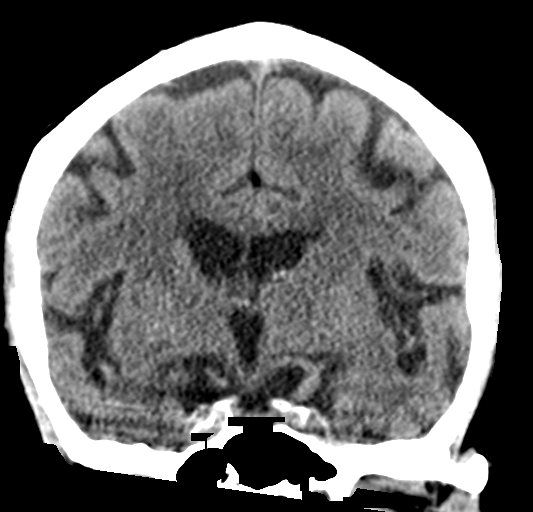

[Series 5: sagittal soft tissue · sagittal · 0.28mm/px · 3 of 50 slices shown]
[im 17/50  brain]
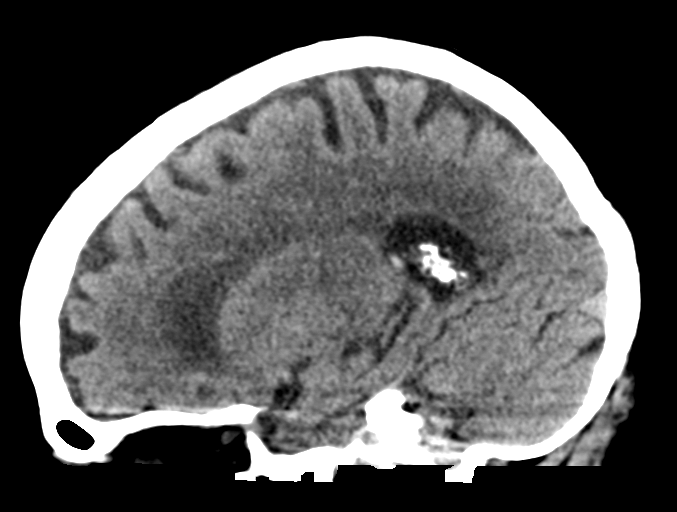
[im 25/50  brain]
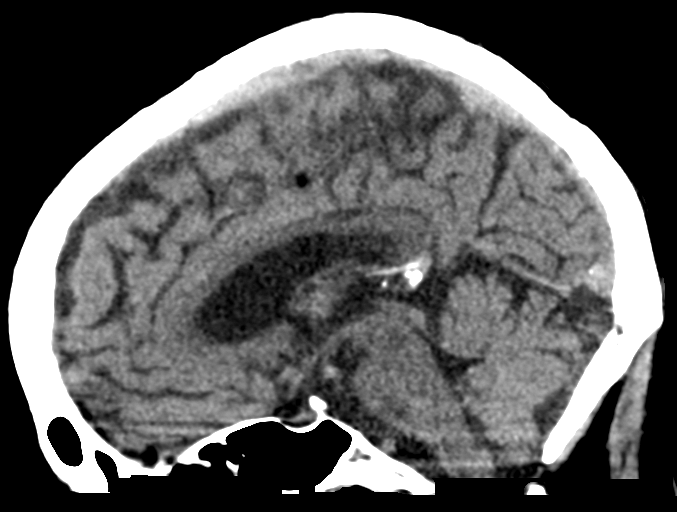
[im 33/50  brain]
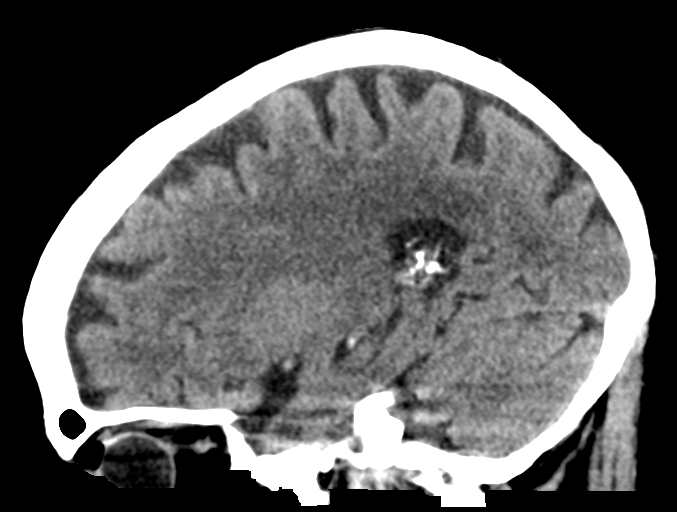

[15 of 46 positions shown; findings below may reference images not displayed]

FINDINGS: Brain: No evidence of acute infarction, hemorrhage, hydrocephalus,
extra-axial collection or mass lesion/mass effect. Nonspecific foci
of hypoattenuation in subcortical and periventricular white matter
compatible with moderate chronic microvascular ischemic changes.
Moderate diffuse brain parenchymal volume loss.

Vascular: Calcific atherosclerosis of vertebral arteries and carotid
siphons.

Skull: Normal. Negative for fracture or focal lesion.

Sinuses/Orbits: No acute finding.

Other: None.
IMPRESSION: 1. No acute intracranial abnormality or calvarial fracture
identified.
2. Moderate chronic microvascular ischemic changes and parenchymal
volume loss of the brain.

By: Fallon Jim M.D.
